# Patient Record
Sex: Female | Born: 1960 | Race: White | Hispanic: No | Smoking: Never smoker
Health system: Southern US, Community
[De-identification: ages and names within clinical notes are randomized; demographics above are authoritative.]

## PROBLEM LIST (undated history)

## (undated) DIAGNOSIS — K219 Gastro-esophageal reflux disease without esophagitis: Secondary | ICD-10-CM

## (undated) DIAGNOSIS — C801 Malignant (primary) neoplasm, unspecified: Secondary | ICD-10-CM

## (undated) DIAGNOSIS — T7840XA Allergy, unspecified, initial encounter: Secondary | ICD-10-CM

## (undated) DIAGNOSIS — M810 Age-related osteoporosis without current pathological fracture: Secondary | ICD-10-CM

## (undated) DIAGNOSIS — Z8719 Personal history of other diseases of the digestive system: Secondary | ICD-10-CM

## (undated) DIAGNOSIS — I10 Essential (primary) hypertension: Secondary | ICD-10-CM

## (undated) DIAGNOSIS — T8859XA Other complications of anesthesia, initial encounter: Secondary | ICD-10-CM

## (undated) DIAGNOSIS — E079 Disorder of thyroid, unspecified: Secondary | ICD-10-CM

## (undated) DIAGNOSIS — R011 Cardiac murmur, unspecified: Secondary | ICD-10-CM

## (undated) DIAGNOSIS — K5900 Constipation, unspecified: Secondary | ICD-10-CM

## (undated) DIAGNOSIS — E785 Hyperlipidemia, unspecified: Secondary | ICD-10-CM

## (undated) DIAGNOSIS — E039 Hypothyroidism, unspecified: Secondary | ICD-10-CM

## (undated) HISTORY — DX: Hyperlipidemia, unspecified: E78.5

## (undated) HISTORY — DX: Gastro-esophageal reflux disease without esophagitis: K21.9

## (undated) HISTORY — DX: Cardiac murmur, unspecified: R01.1

## (undated) HISTORY — DX: Allergy, unspecified, initial encounter: T78.40XA

## (undated) HISTORY — DX: Constipation, unspecified: K59.00

## (undated) HISTORY — PX: OTHER SURGICAL HISTORY: SHX169

## (undated) HISTORY — PX: BREAST EXCISIONAL BIOPSY: SUR124

## (undated) HISTORY — DX: Age-related osteoporosis without current pathological fracture: M81.0

## (undated) HISTORY — DX: Essential (primary) hypertension: I10

## (undated) HISTORY — DX: Disorder of thyroid, unspecified: E07.9

## (undated) HISTORY — DX: Malignant (primary) neoplasm, unspecified: C80.1

---

## 1998-09-24 ENCOUNTER — Other Ambulatory Visit: Admission: RE | Admit: 1998-09-24 | Discharge: 1998-09-24 | Payer: Self-pay | Admitting: *Deleted

## 1999-04-26 ENCOUNTER — Ambulatory Visit (HOSPITAL_COMMUNITY): Admission: RE | Admit: 1999-04-26 | Discharge: 1999-04-26 | Payer: Self-pay | Admitting: Internal Medicine

## 1999-11-10 ENCOUNTER — Other Ambulatory Visit: Admission: RE | Admit: 1999-11-10 | Discharge: 1999-11-10 | Payer: Self-pay | Admitting: *Deleted

## 1999-12-06 ENCOUNTER — Encounter: Payer: Self-pay | Admitting: Oral & Maxillofacial Surgery

## 1999-12-06 ENCOUNTER — Ambulatory Visit (HOSPITAL_COMMUNITY): Admission: RE | Admit: 1999-12-06 | Discharge: 1999-12-06 | Payer: Self-pay | Admitting: Oral & Maxillofacial Surgery

## 2000-04-16 ENCOUNTER — Ambulatory Visit (HOSPITAL_COMMUNITY): Admission: RE | Admit: 2000-04-16 | Discharge: 2000-04-16 | Payer: Self-pay | Admitting: *Deleted

## 2000-04-16 ENCOUNTER — Encounter (INDEPENDENT_AMBULATORY_CARE_PROVIDER_SITE_OTHER): Payer: Self-pay | Admitting: Specialist

## 2000-11-06 ENCOUNTER — Other Ambulatory Visit: Admission: RE | Admit: 2000-11-06 | Discharge: 2000-11-06 | Payer: Self-pay | Admitting: *Deleted

## 2001-05-03 ENCOUNTER — Encounter: Admission: RE | Admit: 2001-05-03 | Discharge: 2001-05-03 | Payer: Self-pay | Admitting: *Deleted

## 2001-11-12 ENCOUNTER — Other Ambulatory Visit: Admission: RE | Admit: 2001-11-12 | Discharge: 2001-11-12 | Payer: Self-pay | Admitting: Obstetrics and Gynecology

## 2002-04-04 ENCOUNTER — Encounter (INDEPENDENT_AMBULATORY_CARE_PROVIDER_SITE_OTHER): Payer: Self-pay | Admitting: Specialist

## 2002-04-04 ENCOUNTER — Ambulatory Visit (HOSPITAL_COMMUNITY): Admission: RE | Admit: 2002-04-04 | Discharge: 2002-04-04 | Payer: Self-pay | Admitting: Urology

## 2002-12-22 ENCOUNTER — Other Ambulatory Visit: Admission: RE | Admit: 2002-12-22 | Discharge: 2002-12-22 | Payer: Self-pay | Admitting: Obstetrics and Gynecology

## 2004-02-09 ENCOUNTER — Other Ambulatory Visit: Admission: RE | Admit: 2004-02-09 | Discharge: 2004-02-09 | Payer: Self-pay | Admitting: Obstetrics and Gynecology

## 2005-01-24 ENCOUNTER — Encounter (INDEPENDENT_AMBULATORY_CARE_PROVIDER_SITE_OTHER): Payer: Self-pay | Admitting: Specialist

## 2005-01-24 ENCOUNTER — Encounter: Admission: RE | Admit: 2005-01-24 | Discharge: 2005-01-24 | Payer: Self-pay | Admitting: Obstetrics and Gynecology

## 2005-02-03 ENCOUNTER — Ambulatory Visit: Payer: Self-pay | Admitting: Internal Medicine

## 2005-02-08 ENCOUNTER — Ambulatory Visit: Payer: Self-pay | Admitting: Cardiology

## 2005-02-09 ENCOUNTER — Encounter: Admission: RE | Admit: 2005-02-09 | Discharge: 2005-02-09 | Payer: Self-pay | Admitting: Obstetrics and Gynecology

## 2005-02-13 ENCOUNTER — Other Ambulatory Visit: Admission: RE | Admit: 2005-02-13 | Discharge: 2005-02-13 | Payer: Self-pay | Admitting: Obstetrics and Gynecology

## 2005-04-06 ENCOUNTER — Ambulatory Visit: Payer: Self-pay | Admitting: Internal Medicine

## 2006-05-25 ENCOUNTER — Encounter: Admission: RE | Admit: 2006-05-25 | Discharge: 2006-05-25 | Payer: Self-pay | Admitting: Obstetrics and Gynecology

## 2006-10-05 ENCOUNTER — Ambulatory Visit: Payer: Self-pay | Admitting: Internal Medicine

## 2007-02-08 ENCOUNTER — Ambulatory Visit: Payer: Self-pay | Admitting: Internal Medicine

## 2007-03-25 ENCOUNTER — Ambulatory Visit: Payer: Self-pay | Admitting: Internal Medicine

## 2007-03-25 LAB — CONVERTED CEMR LAB
BUN: 11 mg/dL (ref 6–23)
CO2: 30 meq/L (ref 19–32)
GFR calc Af Amer: 100 mL/min
Potassium: 4 meq/L (ref 3.5–5.1)
Sodium: 141 meq/L (ref 135–145)

## 2007-04-01 ENCOUNTER — Ambulatory Visit: Payer: Self-pay | Admitting: Internal Medicine

## 2007-04-01 LAB — CONVERTED CEMR LAB
ALT: 14 units/L (ref 0–40)
AST: 18 units/L (ref 0–37)
Albumin: 3.9 g/dL (ref 3.5–5.2)
Alkaline Phosphatase: 45 units/L (ref 39–117)
Direct LDL: 140.4 mg/dL
HDL: 55.4 mg/dL (ref >39.0)
Total Bilirubin: 0.8 mg/dL (ref 0.3–1.2)
VLDL: 15 mg/dL (ref 0–40)

## 2007-04-25 ENCOUNTER — Ambulatory Visit: Payer: Self-pay | Admitting: Internal Medicine

## 2007-10-23 ENCOUNTER — Encounter: Admission: RE | Admit: 2007-10-23 | Discharge: 2007-10-23 | Payer: Self-pay | Admitting: Obstetrics and Gynecology

## 2007-10-29 ENCOUNTER — Encounter: Admission: RE | Admit: 2007-10-29 | Discharge: 2007-10-29 | Payer: Self-pay | Admitting: Obstetrics and Gynecology

## 2008-04-09 ENCOUNTER — Ambulatory Visit: Payer: Self-pay | Admitting: Internal Medicine

## 2008-04-10 ENCOUNTER — Ambulatory Visit: Payer: Self-pay | Admitting: Internal Medicine

## 2008-04-10 LAB — CONVERTED CEMR LAB
BUN: 21 mg/dL (ref 6–23)
CO2: 29 meq/L (ref 19–32)
Calcium: 9.4 mg/dL (ref 8.4–10.5)
Chloride: 105 meq/L (ref 96–112)
Creatinine, Ser: 0.8 mg/dL (ref 0.4–1.2)
Glucose, Bld: 86 mg/dL (ref 70–99)

## 2009-03-01 DIAGNOSIS — E785 Hyperlipidemia, unspecified: Secondary | ICD-10-CM | POA: Insufficient documentation

## 2009-03-01 DIAGNOSIS — I959 Hypotension, unspecified: Secondary | ICD-10-CM | POA: Insufficient documentation

## 2009-07-30 ENCOUNTER — Encounter (INDEPENDENT_AMBULATORY_CARE_PROVIDER_SITE_OTHER): Payer: Self-pay | Admitting: *Deleted

## 2009-11-16 ENCOUNTER — Encounter (INDEPENDENT_AMBULATORY_CARE_PROVIDER_SITE_OTHER): Payer: Self-pay | Admitting: *Deleted

## 2009-11-18 ENCOUNTER — Encounter: Admission: RE | Admit: 2009-11-18 | Discharge: 2009-11-18 | Payer: Self-pay | Admitting: Obstetrics and Gynecology

## 2009-12-16 ENCOUNTER — Encounter (INDEPENDENT_AMBULATORY_CARE_PROVIDER_SITE_OTHER): Payer: Self-pay | Admitting: *Deleted

## 2009-12-20 ENCOUNTER — Ambulatory Visit: Payer: Self-pay | Admitting: Internal Medicine

## 2010-01-04 ENCOUNTER — Ambulatory Visit: Payer: Self-pay | Admitting: Internal Medicine

## 2010-11-17 ENCOUNTER — Encounter
Admission: RE | Admit: 2010-11-17 | Discharge: 2010-11-17 | Payer: Self-pay | Source: Home / Self Care | Admitting: Internal Medicine

## 2010-11-21 ENCOUNTER — Encounter
Admission: RE | Admit: 2010-11-21 | Discharge: 2010-11-21 | Payer: Self-pay | Source: Home / Self Care | Attending: Obstetrics and Gynecology | Admitting: Obstetrics and Gynecology

## 2010-11-29 ENCOUNTER — Encounter
Admission: RE | Admit: 2010-11-29 | Discharge: 2010-11-29 | Payer: Self-pay | Source: Home / Self Care | Attending: Obstetrics and Gynecology | Admitting: Obstetrics and Gynecology

## 2010-12-11 HISTORY — PX: OTHER SURGICAL HISTORY: SHX169

## 2010-12-15 ENCOUNTER — Encounter: Admission: RE | Admit: 2010-12-15 | Payer: Self-pay | Source: Home / Self Care | Admitting: Obstetrics and Gynecology

## 2010-12-31 ENCOUNTER — Other Ambulatory Visit: Payer: Self-pay | Admitting: Surgery

## 2010-12-31 DIAGNOSIS — R920 Mammographic microcalcification found on diagnostic imaging of breast: Secondary | ICD-10-CM

## 2011-01-01 ENCOUNTER — Encounter: Payer: Self-pay | Admitting: Obstetrics and Gynecology

## 2011-01-12 NOTE — Procedures (Signed)
Summary: Colonoscopy  Patient: [name] Note: All result statuses are Final unless otherwise noted.  Tests: (1) Colonoscopy (COL)   COL Colonoscopy           DONE     [facility]     [address]     [place], [place]  [address]           COLONOSCOPY PROCEDURE REPORT           PATIENT:  Victoria [name], Victoria [name]  MR#:  [id number]     BIRTHDATE:  04-28-1961, 48 yrs. old  GENDER:  female           ENDOSCOPIST:  Hedwig Morton. Juanda Chance, MD     Referred by:  Marcelle Overlie, M.D.           PROCEDURE DATE:  01/04/2010     PROCEDURE:  Colonoscopy 95[address]     ASA CLASS:  Class I     INDICATIONS:  history of pre-cancerous (adenomatous) colon polyps     aden. polyp x 2 1998., incomplete exam in 2000, normal colon in     2005     paternal GM colon cancer, daughter celiac disease           MEDICATIONS:   Fentanyl 125 mcg, Benadryl 12.5 mg IV, Versed 12 mg           DESCRIPTION OF PROCEDURE:   After the risks benefits and     alternatives of the procedure were thoroughly explained, informed     consent was obtained.  Digital rectal exam was performed and     revealed no rectal masses.   The LB [id number]-[id number] endoscope     was introduced through the anus and advanced to the cecum, which     was identified by both the appendix and ileocecal valve, without     limitations.  The quality of the prep was excellent, using     MiraLax.  The instrument was then slowly withdrawn as the colon     was fully examined.     <<PROCEDUREIMAGES>>           FINDINGS:  No polyps or cancers were seen (see image1, image2,     image3, and image4).  Internal hemorrhoids were found (see     image5).   Retroflexed views in the rectum revealed no     abnormalities.    The scope was then withdrawn from the patient     and the procedure completed.           COMPLICATIONS:  None           ENDOSCOPIC IMPRESSION:     1) No polyps or cancers     2) Normal colonoscopy     RECOMMENDATIONS:     1) high fiber diet       REPEAT EXAM:  In 7 year(s) for.           ______________________________     Hedwig Morton. Juanda Chance, MD           CC:           n.     eSIGNED:   Hedwig Morton. Brodie at 01/04/2010 08:44 AM           Lamount Cranker, [id number]  Note: An exclamation mark (!) indicates a result that was not dispersed into the flowsheet. Document Creation Date: 01/04/2010 8:45 AM _______________________________________________________________________  (1) Order result status: Final Collection or observation date-time: 01/04/2010 08:34  Requested date-time:  Receipt date-time:  Reported date-time:  Referring Physician:   Ordering Physician: Lina Sar [id number]) Specimen Source:  Source: Launa Grill Order Number: [id number] Lab site:   Appended Document: Colonoscopy    Clinical Lists Changes  Observations: Added new observation of COLONNXTDUE: 12/2016 (01/04/2010 12:42)

## 2011-01-12 NOTE — Letter (Signed)
Summary: [facility] Instructions  [facility]  [address] [place], [place] [address]   Phone: [phone]  Fax: [phone]       Victoria [name]    October 15, 1961    MRN: [id number]       Procedure Day /Date:  Tuesday 01/04/2010     Arrival Time:  7:30 am     Procedure Time:  8:00 am     Location of Procedure:                    _ x_  [facility] (4th Floor)   PREPARATION FOR COLONOSCOPY WITH MIRALAX  Starting 5 days prior to your procedure Thursday 1/20 do not eat nuts, seeds, popcorn, corn, beans, peas,  salads, or any raw vegetables.  Do not take any fiber supplements (e.g. Metamucil, Citrucel, and Benefiber). ____________________________________________________________________________________________________   THE DAY BEFORE YOUR PROCEDURE         DATE: Monday 1/24  1   Drink clear liquids the entire day-NO SOLID FOOD  2   Do not drink anything colored red or purple.  Avoid juices with pulp.  No orange juice.  3   Drink at least 64 oz. (8 glasses) of fluid/clear liquids during the day to prevent dehydration and help the prep work efficiently.  CLEAR LIQUIDS INCLUDE: Water Jello Ice Popsicles Tea (sugar ok, no milk/cream) Powdered fruit flavored drinks Coffee (sugar ok, no milk/cream) Gatorade Juice: apple, white grape, white cranberry  Lemonade Clear bullion, consomm, broth Carbonated beverages (any kind) Strained chicken noodle soup Hard Candy  4   Mix the entire bottle of Miralax with 64 oz. of Gatorade/Powerade in the morning and put in the refrigerator to chill.  5   At 3:00 pm take 2 Dulcolax/Bisacodyl tablets.  6   At 4:30 pm take one Reglan/Metoclopramide tablet.  7  Starting at 5:00 pm drink one 8 oz glass of the Miralax mixture every 15-20 minutes until you have finished drinking the entire 64 oz.  You should finish drinking prep around 7:30 or 8:00 pm.  8   If you are nauseated, you may take the 2nd Reglan/Metoclopramide tablet  at 6:30 pm.        9    At 8:00 pm take 2 more DULCOLAX/Bisacodyl tablets.     THE DAY OF YOUR PROCEDURE      DATE:  Tuesday 1/25  You may drink clear liquids until 6:00 am (2 HOURS BEFORE PROCEDURE).   MEDICATION INSTRUCTIONS  Unless otherwise instructed, you should take regular prescription medications with a small sip of water as early as possible the morning of your procedure.    Additional medication instructions: Hold Spironolactone day of procedure.         OTHER INSTRUCTIONS  You will need a responsible adult at least 48 years of age to accompany you and drive you home.   This person must remain in the waiting room during your procedure.  Wear loose fitting clothing that is easily removed.  Leave jewelry and other valuables at home.  However, you may wish to bring a book to read or an iPod/MP3 player to listen to music as you wait for your procedure to start.  Remove all body piercing jewelry and leave at home.  Total time from sign-in until discharge is approximately 2-3 hours.  You should go home directly after your procedure and rest.  You can resume normal activities the day after your procedure.  The day of  your procedure you should not:   Drive   Make legal decisions   Operate machinery   Drink alcohol   Return to work  You will receive specific instructions about eating, activities and medications before you leave.   The above instructions have been reviewed and explained to me by   Ezra Sites RN  December 20, 2009 9:38 AM     I fully understand and can verbalize these instructions _____________________________ Date _______

## 2011-01-12 NOTE — Miscellaneous (Signed)
Summary: [facility]  Clinical Lists Changes  Medications: Added new medication of MIRALAX   POWD (POLYETHYLENE GLYCOL 3350) As per prep  instructions. - Signed Added new medication of REGLAN 10 MG  TABS (METOCLOPRAMIDE HCL) As per prep instructions. - Signed Added new medication of DULCOLAX 5 MG  TBEC (BISACODYL) Day before procedure take 2 at 3pm and 2 at 8pm. - Signed Rx of MIRALAX   POWD (POLYETHYLENE GLYCOL 3350) As per prep  instructions.;  #255gm x 0;  Signed;  Entered by: Ezra Sites RN;  Authorized by: Hart Carwin MD;  Method used: Electronically to [facility] [address] [place], [place]  [address], Ph: [phone] or [phone], Fax: [phone] Rx of REGLAN 10 MG  TABS (METOCLOPRAMIDE HCL) As per prep instructions.;  #2 x 0;  Signed;  Entered by: Ezra Sites RN;  Authorized by: Hart Carwin MD;  Method used: Electronically to [facility] [address], [place], [place]  [address], Ph: [phone] or [phone], Fax: [phone] Rx of DULCOLAX 5 MG  TBEC (BISACODYL) Day before procedure take 2 at 3pm and 2 at 8pm.;  #4 x 0;  Signed;  Entered by: Ezra Sites RN;  Authorized by: Hart Carwin MD;  Method used: Electronically to [facility] [address], [place], [place]  [address], Ph: [phone] or [phone], Fax: [phone] Allergies: Added new allergy or adverse reaction of PENICILLIN Added new allergy or adverse reaction of * ADHESIVE TAPE Observations: Added new observation of NKA: F (12/20/2009 8:45)    Prescriptions: DULCOLAX 5 MG  TBEC (BISACODYL) Day before procedure take 2 at 3pm and 2 at 8pm.  #4 x 0   Entered by:   Ezra Sites RN   Authorized by:   Hart Carwin MD   Signed by:   Ezra Sites RN on 12/20/2009   Method used:   Electronically to        [facility] (retail)       [address]       [place], [place]  [address]       Ph: [phone] or [phone]       Fax: [phone]   RxID:   [id number] REGLAN 10 MG  TABS  (METOCLOPRAMIDE HCL) As per prep instructions.  #2 x 0   Entered by:   Ezra Sites RN   Authorized by:   Hart Carwin MD   Signed by:   Ezra Sites RN on 12/20/2009   Method used:   Electronically to        [facility] (retail)       [address]       [place], [place]  [address]       Ph: [phone] or [phone]       Fax: [phone]   RxID:   [id number] MIRALAX   POWD (POLYETHYLENE GLYCOL 3350) As per prep  instructions.  #255gm x 0   Entered by:   Ezra Sites RN   Authorized by:   Hart Carwin MD   Signed by:   Ezra Sites RN on 12/20/2009   Method used:   Electronically to        [facility] (retail)       [address]       [place], [place]  [address]       Ph: [phone] or [phone]       Fax: [phone]   RxID:   [id number]

## 2011-01-19 ENCOUNTER — Other Ambulatory Visit: Payer: Self-pay | Admitting: Surgery

## 2011-01-19 DIAGNOSIS — R920 Mammographic microcalcification found on diagnostic imaging of breast: Secondary | ICD-10-CM

## 2011-01-25 ENCOUNTER — Ambulatory Visit
Admission: RE | Admit: 2011-01-25 | Discharge: 2011-01-25 | Disposition: A | Discharge status: Home / Self Care | Payer: 59 | Source: Ambulatory Visit | Attending: Surgery | Admitting: Surgery

## 2011-01-25 ENCOUNTER — Other Ambulatory Visit: Payer: Self-pay | Admitting: Surgery

## 2011-01-25 ENCOUNTER — Other Ambulatory Visit (HOSPITAL_COMMUNITY)
Admission: RE | Admit: 2011-01-25 | Discharge: 2011-01-25 | Disposition: A | Discharge status: Home / Self Care | Payer: 59 | Source: Ambulatory Visit | Attending: Obstetrics and Gynecology | Admitting: Obstetrics and Gynecology

## 2011-01-25 DIAGNOSIS — N83209 Unspecified ovarian cyst, unspecified side: Secondary | ICD-10-CM | POA: Insufficient documentation

## 2011-01-25 DIAGNOSIS — R920 Mammographic microcalcification found on diagnostic imaging of breast: Secondary | ICD-10-CM

## 2011-01-25 DIAGNOSIS — N6009 Solitary cyst of unspecified breast: Secondary | ICD-10-CM | POA: Insufficient documentation

## 2011-01-27 ENCOUNTER — Ambulatory Visit (HOSPITAL_COMMUNITY)
Admission: RE | Admit: 2011-01-27 | Discharge: 2011-01-27 | Disposition: A | Discharge status: Home / Self Care | Payer: 59 | Source: Ambulatory Visit | Attending: Obstetrics and Gynecology | Admitting: Obstetrics and Gynecology

## 2011-01-27 DIAGNOSIS — M79609 Pain in unspecified limb: Secondary | ICD-10-CM | POA: Insufficient documentation

## 2011-04-25 NOTE — Assessment & Plan Note (Signed)
[facility]                            CARDIOLOGY OFFICE NOTE   NAME:Victoria [name], Victoria [name]                        MRN:          [id number]  DATE:04/09/2008                            DOB:          10/11/1961    IDENTIFICATION:  The patient is a 50 year old woman with a history of  hypertension and dyslipidemia.  I last saw her back in May 2008.   HISTORY OF PRESENT ILLNESS:  Since seen, the patient has said she had  stopped the Elmiron, and her blood pressure improved.  Her swelling in  her hands also improved.  She is taking Lasix here and there as needed.  Her breathing is okay.  Blood pressure at home has been 124-135/74-88.  She takes Maxzide on and off.   Her current medicines now include:  1. Amitriptyline 10.  2. Prevacid 30 p.r.n.  3. Triamterene hydrochlorothiazide 37.5/25 one half daily.   PHYSICAL EXAMINATION:  On exam, the patient is in no distress here.  Blood pressure is 135/90 pulse is 88, weight 136 which is up from 128  when last checked.  Her lungs are clear.  CARDIAC EXAM:  Regular rate and rhythm, S1-S2, no S3, no murmurs.  ABDOMEN:  Benign.  EXTREMITIES:  No edema.   IMPRESSION:  1. Hypertension, borderline here, better at home.  I am not sure she      does not need more medicine.  What I have recommended and she is      eager to work on her diet and exercise, try to get her weight down.      I have recommend we check a Lipomed, BMET, AST, thyroid.  Also      check a CRP.  2. Dyslipidemia.  As noted above.  Labs pending.   I will be in touch with the patient once I have seen all of the lab  results.     Pricilla Riffle, MD, [facility]  Electronically Signed    PVR/MedQ  DD: 04/21/2008  DT: 04/21/2008  Job #: [id number]

## 2011-04-25 NOTE — Assessment & Plan Note (Signed)
[facility]                            CARDIOLOGY OFFICE NOTE   NAME:[name], [name]                        MRN:          [id number]  DATE:04/25/2007                            DOB:          Sep 16, 1961    IDENTIFICATION:  The patient is a 50 year old woman with a history of  hypertension. I last saw her in April.   When I saw her last, I told her to increase her Maxzide to a whole  tablet. Note, she is still taking a half in the morning and then she  will take her blood pressure and if it is high she will take another  half again as needed (half of 37.5/25). She says some days her blood  pressure is okay in the 120s/80s, but some days when she is stressed she  will have to take an additional hypertensive. Again, she is through a  lot of her projects and thinks her stress level is less.   Occasional chest tightness comes and goes usually again when stressed.  Has not had any over the past week. Mental stress has gone down. Denies  any association with activity.   On reviewing her diet, for breakfast, she will have a whole egg. She  will eat ice cream and some chocolates.   CURRENT MEDICATIONS:  1. Elmiron 100.  2. Amitriptyline 10.  3. Prevacid.  4. Triamterene/hydrochlorothiazide one-half 37.5/25, occasionally a      whole.   PHYSICAL EXAMINATION:  The patient is in no distress. Blood pressure  115/86. Pulse 93 and regular. Weight 128.  LUNGS:  Clear.  NECK: JVP is normal.  CARDIAC: Regular rate and rhythm. S1, S2. No S3.  ABDOMEN: Benign.  EXTREMITIES: No edema.   IMPRESSION:  1. Hypertension. The patient will continue to follow and take her      Maxzide. Again, I think she is on the border to needing a whole      tablet, but she is taking off and will follow.  2. Dyslipidemia. Counseled her on diet modifications. Again, her LDL      is about 140, HDL of 55. She will try to make some changes and will      recheck in the fall. Encouraged  her to stay active.  3. Chest tightness. Again, I am not convinced it is cardiac. Has      Prevacid. Will follow.   I will follow up in the fall, sooner if problems develop.     Victoria Riffle, MD, [name]  Electronically Signed    PVR/MedQ  DD: 04/29/2007  DT: 04/29/2007  Job #: [id number]

## 2011-04-28 NOTE — Op Note (Signed)
[facility]  Patient:    Victoria [name], Victoria [name] Visit Number: [id number] MRN: [id number]          Service Type: DSU Location: DAY Attending Physician:  Londell Moh Dictated by:   Jamison Neighbor, M.D. Proc. Date: 04/04/02 Admit Date:  04/04/2002   CC:         Marcelle Overlie, M.D.   Operative Report  PREOPERATIVE DIAGNOSIS:  Pelvic pain probable interstitial cystitis.  POSTOPERATIVE DIAGNOSIS:  Interstitial cystitis.  PROCEDURE:  Cystoscopy, urethral calibration, hydrodistention of the bladder, Marcaine and Pyridium instillation, Marcaine and Kenalog injection.  SURGEON:  Jamison Neighbor, M.D.  ANESTHESIA:  General.  COMPLICATIONS:  None.  DRAINS:  None.  BRIEF HISTORY:  This 50 year old female has had lower urinary tract symptoms including urgency and frequency.  The patient has had longstanding problems with both problems as well as dyspareunia.  She has previously been evaluated by Dr. Darvin Neighbours ______  cystoscopy under anesthesia which was quite painful, although it should be noted that this is certainly not a standard method of making a diagnosis of interstitial cystitis.  The patient was treated with various anticholinergics with modest improvement.  The patient still has problems with pain and dyspareunia.  She is also known to have migraines, TMJ syndrome, and multiple allergies, all of which would suggest that the patient might have interstitial cystitis.  The patient is now to undergo diagnostic cystoscopy under anesthesia to determine if she does indeed have this disease. She understands the risks and benefits of the procedure.  She knows that there is a chance that she will have some improvement in her symptoms, but that will most likely be short-lived, and that long-term therapy will be necessary.  She gave full and informed consent.  DESCRIPTION OF PROCEDURE:  After the successful induction of general anesthesia, the patient  was placed in the dorsal lithotomy position and prepped with Betadine and draped in the usual sterile fashion.  The patient had no evidence of cystocele, rectocele, or enterocele.  There was no evidence of urethral mass or diverticulum.  The urethra was calibrated to 30 [place] with female urethral sounds with no evidence of stenosis or stricture.  The bladder was carefully inspected.  It was free of any tumor or stones.  Both ureteral orifices were normal in configuration and location.  The bladder was distended at a pressure of 100 cm of water for five minutes.  When the bladder was drained, she had terminal bleeding at the end of the drain out cycle. Glomerulations were seen throughout the bladder.  She had a bladder capacity of only 500 cc which is less than half of normal.  The patient had a bladder biopsy, and the biopsy site was cauterized and sent for mast cell analysis. The biopsy site was cauterized with a Bugbee electrode.  A mixture of Marcaine Pyridium was left within the bladder.  Marcaine and Kenalog were injected periurethrally.  A B&O suppository was inserted.  The patient has had intraoperative Toradol and Zofran.  She will be sent home with a prescription for Lorcet Plus, Pyridium Plus, and a short course of antibiotics.  We will see her back in follow-up in 1-2 weeks time and will make plans for long-term therapy which may include either instillation therapy or oral therapy with an Elmiron-based protocol. Dictated by:   Jamison Neighbor, M.D. Attending Physician:  Londell Moh DD:  04/04/02 TD:  04/04/02 Job: [id number]

## 2011-04-28 NOTE — Op Note (Signed)
[facility]  Patient:    Victoria [name], Victoria [name]                        MRN: [id number] Proc. Date: 04/16/00 Adm. Date:  [id number] Disc. Date: [id number] Attending:  Marin Comment CC:         Al Decant. Janey Greaser, M.D.             Pershing Cox, M.D.                           Operative Report  PREOPERATIVE DIAGNOSIS:  Metrorrhagia with office hydrosonogram suggesting a small fundal polyp.  POSTOPERATIVE DIAGNOSES:  No evidence of fundal polyps, no evidence of submucous myoma, shaggy lower uterine segment endometrial tissue.  PROCEDURE:  SURGEON:  Pershing Cox, M.D.  ANESTHESIA:  INDICATION FOR PROCEDURE:  Patient is followed in my office for routine gynecologic care.  In November, at her annual examination, she complained of midcycle spotting which had been going on for approximately one year.  She was given a menstrual calendar and returned for sonogram in January of this year. At that time, she was documented to have two very small uterine fibroids and one small paracervical fibroid.  Her endometrial lining at that time was 0.8 cm and echogenic in character.  She was brought back to the office for hydrosonogram if her spotting continued, it did continue, and on March 16, 2000, hydrosonogram was performed following a menstrual period.  Her endometrial lining was still thickened, approximately 8 mm in size.  There was a hyperechoic mass located at the uterine fundus which was 0.3 x 0.3 in size, felt to be a small polyp, maybe the cause of her midcycle spotting.  She is brought to the operating room for evaluation and excision of this polyp.  OPERATIVE FINDINGS:  Uterine cavity was 3 cm in size.  The uterus was 8 to 10 cm in size on bimanual examination.  There were no adnexal masses.  Endoscopic examination of the uterine cavity visualized both ostia.  There was no evidence of submucous filling defect, either submucosa myoma or  discrete polyp.  There was heavy shaggy lower uterine segment endometrium.  This as well as some of the fundal endometrium were resected with the rectoscope.  The two specimens were endocervical curettings and endometrial curettings and resection.  DESCRIPTION OF PROCEDURE:  Victoria [name] was brought to the operating room with an IV in place.  She had received a gram of Ancef in the holding area.  Supine on the OR table, IV sedation was administered.  She was then placed into sling stirrups and exam under anesthesia was performed.  There was a very thick heavy vaginal discharge consistent with barrier contraception.  This was removed with digital examination and bimanual exam was performed.  The lower abdomen, perineum, upper thighs and vagina were prepped with a solution of Hibiclens and the patient was sterilely draped for a vaginal procedure. Bivalved speculum was inserted into the vagina.  Marcaine 0.25% was instilled into the anterior cervix, which was then grasped with a single-tooth tenaculum.  Paracervical block was administered at the 3, 4, 7 and 8 position, giving a total volume of 10 cc of 0.25% Marcaine.  Kevorkian curette was used to obtain endometrial curettings.  A uterine sound passed to a depth of 9 cm. Serial Pratt dilators were used to dilate the cervix to a size 33  and the resectoscope was introduced.  Using through-and-through sorbitol irrigation, the cavity was visualized and photographed.  Next, the small areas of uterine tissue at the fundus were resected and the rectoscope was removed.  A sharp curette was used to curette all the uterine walls and the curettings were removed by suction as best as possible.  The hysteroscope was reintroduced and there was no visible tissue other than floating fragments.  The Meigs curette was then used to curette the uterine walls and an attempt was made to retrieve as much of the curetted endometrium as possible, retrieving it with  the resectoscope.  It was combined with the fragments of curetted endometrium for a single specimen.  Again, no discrete polyps were noted.  Patient tolerated the procedure well and taken to the recovery room where she received IV Toradol and was discharged later in the morning. DD:  04/16/00 TD:  04/17/00 Job: [id number]

## 2011-04-28 NOTE — Assessment & Plan Note (Signed)
[facility]                            CARDIOLOGY OFFICE NOTE   NAME:[name], Victoria GREGGS                        MRN:          [id number]  DATE:02/08/2007                            DOB:          Nov 25, 1961    IDENTIFICATION:  [name] is a 50 year old woman, history of mild  hypertension, last seen in October.   When I saw her I added Norvasc to her regimen.  I thought a followup in  6 weeks.  Unfortunately she has had family issues develop, her father  passed away and she was tied up with this.  She did have blood work  drawn in [place], she said, though she did not bring it with her today.   Otherwise she says she has been feeling okay, she is trying to increase  her physical activity, getting her life back organized now that she is  back in [place].  Note daughter also with celiac disease, she is  trying to follow a gluten-free diet with her.   CURRENT MEDICATIONS:  1. Elmiron nightly.  2. Amitriptyline 10 daily.  3. Prevacid p.r.n.  4. Norvasc 2.5.   PHYSICAL EXAMINATION:  The patient is in no distress.  Blood pressure is 145/90, on my check 122/88, pulse is 88 and regular,  weight 129, stable.  LUNGS:  Clear.  CARDIAC:  Regular rate and rhythm, S1, S2, no S3, no murmurs.  ABDOMEN:  Benign.  EXTREMITIES:  No edema.   IMPRESSION:  1. Hypertension, again borderline with minimal elevation of her      diastolic pressures.  She has got a little edema in her hands she      complains about.  I told her to hold this and try a half of a      Maxzide 37.5/25, follow up in 1 month's time to see how she      tolerates this.   She is on the amitriptyline, which may be exacerbating some.  1. Health care maintenance.  Discussed increasing her physical      activity.  Again review of her lipid panel has been remote, I would      like to have another fasting lipid checked, LDL was 138.   Follow up as noted.     Pricilla Riffle, MD, [facility]  Electronically Signed    PVR/MedQ  DD: 02/08/2007  DT: 02/08/2007  Job #: [id number]   cc:   Marcelino Duster L. Vincente Poli, M.D.

## 2011-04-28 NOTE — Assessment & Plan Note (Signed)
[facility]                            CARDIOLOGY OFFICE NOTE   NAME:[name], KASSIDEE NARCISO                        MRN:          [id number]  DATE:03/25/2007                            DOB:          Aug 04, 1961    PATIENT IDENTIFICATION:  [name] is a 50 year old woman with a history  of hypertension. She was last seen in February.   When I saw her last, her blood pressure was a little up and I added a  half tablet of Maxzide (37.5/25) to her regimen.   In the interval, she says she has been feeling pretty good. She is  actually very active, working out.   She had a long day touring a college campus this weekend and complained  of some dizziness and some puffiness on the weekend. She says though her  puffiness overall has improved on the Maxzide and her feet hurt less.   CURRENT MEDICATIONS:  1. Elmiron 100 q.h.s.  2. Amitriptyline 10 daily.  3. Prevacid 30 daily.  4. Maxzide 37.5/25, 1/2 daily.   PHYSICAL EXAMINATION:  GENERAL:  The patient is in no distress.  VITAL SIGNS:  Blood pressure 134/93, on my check 134/94.  LUNGS:  Clear.  CARDIAC:  Regular rate and rhythm, S1, S2, no S3.  ABDOMEN:  Benign.  EXTREMITIES:  No edema.   IMPRESSION:  1. Hypertension. Would increase Maxzide to a whole tablet. Will check      a BMET today and also in one week.  2. Dyslipidemia. Will need to get fasting lipids at some point.   I will set to see her back in 4 weeks, sooner if problems develop.     Pricilla Riffle, MD, [facility]  Electronically Signed    PVR/MedQ  DD: 03/25/2007  DT: 03/26/2007  Job #: [id number]

## 2011-04-28 NOTE — Assessment & Plan Note (Signed)
[facility]                              CARDIOLOGY OFFICE NOTE   NAME:[name], [name]                        MRN:          [id number]  DATE:10/05/2006                            DOB:          [birth year]    IDENTIFICATION:  [name] is a 50 year old woman who I saw back in April  of 2006. She has a history of hypertension (mild).   In the interval she has done okay.  She still says she is trying to get to  exercising.  She notes an episode of chest discomfort last week.  It kinda  hurt on and off 30 seconds to a minute at a time, plus/minus activity,  associated with back pain as well. She thinks she may have pulled something.   CURRENT MEDICATIONS:  1. Elmiron 100 mg at night.  2. Amitriptyline 10 mg at night.  3. Prevacid 30 mg daily.   PAST MEDICAL HISTORY:  Interstitial cystitis, mild hypertension and  gastroesophageal reflux.   PHYSICAL EXAMINATION:  GENERAL:  The patient is in no distress.  VITAL SIGNS:  Blood pressure 133/92, pulse 98, weight 129.  LUNGS:  Clear.  NECK:  JVP is normal.  No bruits.  CARDIAC:  Regular rate and rhythm.  S1 and S2.  No S3.  No murmurs.  ABDOMEN:  Benign.  EXTREMITIES:  Good distal pulses, equal onset.  No edema.   A 12-lead EKG shows normal sinus rhythm, 97 beats per minute.   IMPRESSION:  1. Hypertension.  The patient reports at home her diastolics are as a      whole are running around 90, systolics in the 140s.  Had been 150s.  I      think she needs to start antihypertensive therapy.  We have seen this      now for years.  I will go ahead and give her a prescription for 2.5 of      Norvasc and I will see her back in about six weeks.  2. Health care maintenance.  Will need to follow up on fasting lipids.      Actually she had one done in 2006, total of 209, HDL 59, LDL 137.      Again will need to check at her next visit.    ______________________________  Pricilla Riffle, MD, [facility]    PVR/MedQ   DD: 10/05/2006  DT: 10/07/2006  Job #: [id number]   cc:   Marcelino Duster L. Vincente Poli, M.D.

## 2011-09-04 ENCOUNTER — Other Ambulatory Visit: Payer: Self-pay | Admitting: Family Medicine

## 2011-09-04 DIAGNOSIS — N390 Urinary tract infection, site not specified: Secondary | ICD-10-CM

## 2011-09-04 DIAGNOSIS — E278 Other specified disorders of adrenal gland: Secondary | ICD-10-CM

## 2011-09-08 ENCOUNTER — Ambulatory Visit
Admission: RE | Admit: 2011-09-08 | Discharge: 2011-09-08 | Disposition: A | Discharge status: Home / Self Care | Payer: 59 | Source: Ambulatory Visit | Attending: Family Medicine | Admitting: Family Medicine

## 2011-09-08 DIAGNOSIS — N390 Urinary tract infection, site not specified: Secondary | ICD-10-CM

## 2011-09-08 DIAGNOSIS — E278 Other specified disorders of adrenal gland: Secondary | ICD-10-CM

## 2011-11-07 ENCOUNTER — Other Ambulatory Visit: Payer: Self-pay | Admitting: Obstetrics and Gynecology

## 2011-11-07 DIAGNOSIS — Z1231 Encounter for screening mammogram for malignant neoplasm of breast: Secondary | ICD-10-CM

## 2011-11-23 ENCOUNTER — Ambulatory Visit
Admission: RE | Admit: 2011-11-23 | Discharge: 2011-11-23 | Disposition: A | Discharge status: Home / Self Care | Payer: 59 | Source: Ambulatory Visit | Attending: Obstetrics and Gynecology | Admitting: Obstetrics and Gynecology

## 2011-11-23 DIAGNOSIS — Z1231 Encounter for screening mammogram for malignant neoplasm of breast: Secondary | ICD-10-CM

## 2012-02-07 ENCOUNTER — Other Ambulatory Visit: Payer: Self-pay | Admitting: Obstetrics and Gynecology

## 2012-11-15 ENCOUNTER — Other Ambulatory Visit: Payer: Self-pay | Admitting: Obstetrics and Gynecology

## 2012-11-15 DIAGNOSIS — Z1231 Encounter for screening mammogram for malignant neoplasm of breast: Secondary | ICD-10-CM

## 2012-11-28 ENCOUNTER — Ambulatory Visit
Admission: RE | Admit: 2012-11-28 | Discharge: 2012-11-28 | Disposition: A | Discharge status: Home / Self Care | Payer: 59 | Source: Ambulatory Visit | Attending: Obstetrics and Gynecology | Admitting: Obstetrics and Gynecology

## 2012-11-28 DIAGNOSIS — Z1231 Encounter for screening mammogram for malignant neoplasm of breast: Secondary | ICD-10-CM

## 2012-12-02 ENCOUNTER — Other Ambulatory Visit: Payer: Self-pay | Admitting: Obstetrics and Gynecology

## 2012-12-02 DIAGNOSIS — R928 Other abnormal and inconclusive findings on diagnostic imaging of breast: Secondary | ICD-10-CM

## 2012-12-02 DIAGNOSIS — R921 Mammographic calcification found on diagnostic imaging of breast: Secondary | ICD-10-CM

## 2012-12-05 ENCOUNTER — Other Ambulatory Visit: Payer: Self-pay | Admitting: Obstetrics and Gynecology

## 2012-12-05 ENCOUNTER — Ambulatory Visit
Admission: RE | Admit: 2012-12-05 | Discharge: 2012-12-05 | Disposition: A | Discharge status: Home / Self Care | Payer: 59 | Source: Ambulatory Visit | Attending: Obstetrics and Gynecology | Admitting: Obstetrics and Gynecology

## 2012-12-05 DIAGNOSIS — R928 Other abnormal and inconclusive findings on diagnostic imaging of breast: Secondary | ICD-10-CM

## 2012-12-09 ENCOUNTER — Other Ambulatory Visit: Payer: Self-pay | Admitting: Nurse Practitioner

## 2012-12-09 ENCOUNTER — Ambulatory Visit
Admission: RE | Admit: 2012-12-09 | Discharge: 2012-12-09 | Disposition: A | Discharge status: Home / Self Care | Payer: 59 | Source: Ambulatory Visit | Attending: Nurse Practitioner | Admitting: Nurse Practitioner

## 2012-12-09 DIAGNOSIS — R079 Chest pain, unspecified: Secondary | ICD-10-CM

## 2012-12-18 ENCOUNTER — Other Ambulatory Visit: Payer: Self-pay | Admitting: Obstetrics and Gynecology

## 2012-12-18 ENCOUNTER — Ambulatory Visit
Admission: RE | Admit: 2012-12-18 | Discharge: 2012-12-18 | Disposition: A | Discharge status: Home / Self Care | Payer: 59 | Source: Ambulatory Visit | Attending: Obstetrics and Gynecology | Admitting: Obstetrics and Gynecology

## 2012-12-18 DIAGNOSIS — R928 Other abnormal and inconclusive findings on diagnostic imaging of breast: Secondary | ICD-10-CM

## 2012-12-18 DIAGNOSIS — R921 Mammographic calcification found on diagnostic imaging of breast: Secondary | ICD-10-CM

## 2012-12-19 ENCOUNTER — Ambulatory Visit
Admission: RE | Admit: 2012-12-19 | Discharge: 2012-12-19 | Disposition: A | Discharge status: Home / Self Care | Payer: 59 | Source: Ambulatory Visit | Attending: Obstetrics and Gynecology | Admitting: Obstetrics and Gynecology

## 2012-12-19 DIAGNOSIS — R928 Other abnormal and inconclusive findings on diagnostic imaging of breast: Secondary | ICD-10-CM

## 2013-02-17 ENCOUNTER — Other Ambulatory Visit: Payer: Self-pay | Admitting: Obstetrics and Gynecology

## 2013-11-10 ENCOUNTER — Other Ambulatory Visit: Payer: Self-pay

## 2013-11-10 DIAGNOSIS — Z1231 Encounter for screening mammogram for malignant neoplasm of breast: Secondary | ICD-10-CM

## 2013-11-27 ENCOUNTER — Telehealth: Payer: Self-pay | Admitting: Internal Medicine

## 2013-11-27 NOTE — Telephone Encounter (Signed)
Informed pt that due to the holidays, Dr. Graciela Husbands is out of the office until the end of the month. I will contact her once he is back in office. She is agreeable to plan.

## 2013-11-27 NOTE — Telephone Encounter (Signed)
New Message  Pt called requests a call back.. She has moved to [place] and requests a referral for a cardiologist in the area.. Please assist.

## 2013-12-12 ENCOUNTER — Ambulatory Visit
Admission: RE | Admit: 2013-12-12 | Discharge: 2013-12-12 | Disposition: A | Discharge status: Home / Self Care | Payer: 59 | Source: Ambulatory Visit

## 2013-12-12 DIAGNOSIS — Z1231 Encounter for screening mammogram for malignant neoplasm of breast: Secondary | ICD-10-CM

## 2013-12-19 NOTE — Telephone Encounter (Signed)
Advised Victoria [name] that Dr. Caryl Comes did not know of cardiologist to refer her to in [place], keep appt with Dr. Heide Guile. Victoria [name] thanked me and agreeable to plan.

## 2014-02-20 ENCOUNTER — Other Ambulatory Visit: Payer: Self-pay | Admitting: Obstetrics and Gynecology

## 2014-04-29 ENCOUNTER — Other Ambulatory Visit: Payer: Self-pay | Admitting: Obstetrics and Gynecology

## 2014-08-27 ENCOUNTER — Other Ambulatory Visit: Payer: Self-pay | Admitting: Obstetrics and Gynecology

## 2014-08-28 LAB — CYTOLOGY - PAP

## 2014-11-18 ENCOUNTER — Other Ambulatory Visit: Payer: Self-pay

## 2014-11-18 DIAGNOSIS — Z1231 Encounter for screening mammogram for malignant neoplasm of breast: Secondary | ICD-10-CM

## 2014-12-17 ENCOUNTER — Ambulatory Visit
Admission: RE | Admit: 2014-12-17 | Discharge: 2014-12-17 | Disposition: A | Discharge status: Home / Self Care | Payer: 59 | Source: Ambulatory Visit

## 2014-12-17 DIAGNOSIS — Z1231 Encounter for screening mammogram for malignant neoplasm of breast: Secondary | ICD-10-CM

## 2015-07-15 ENCOUNTER — Encounter: Payer: Self-pay | Admitting: Internal Medicine

## 2016-11-21 ENCOUNTER — Other Ambulatory Visit: Payer: Self-pay | Admitting: Obstetrics and Gynecology

## 2016-11-21 DIAGNOSIS — Z1231 Encounter for screening mammogram for malignant neoplasm of breast: Secondary | ICD-10-CM

## 2016-11-28 ENCOUNTER — Encounter: Payer: Self-pay | Admitting: Physical Therapy

## 2016-11-28 ENCOUNTER — Ambulatory Visit: Payer: BLUE CROSS/BLUE SHIELD | Attending: Obstetrics and Gynecology | Admitting: Physical Therapy

## 2016-11-28 DIAGNOSIS — M6281 Muscle weakness (generalized): Secondary | ICD-10-CM

## 2016-11-28 DIAGNOSIS — R279 Unspecified lack of coordination: Secondary | ICD-10-CM | POA: Diagnosis present

## 2016-11-28 DIAGNOSIS — M62838 Other muscle spasm: Secondary | ICD-10-CM | POA: Diagnosis present

## 2016-11-28 NOTE — Therapy (Signed)
[facility] [address] [place], [place], [address] Phone: [phone]   Fax:  [phone]  Physical Therapy Evaluation  Patient Details  Name: Victoria [name] MRN: [id number] Date of Birth: 1961-12-09 Referring Provider: Dr. Dian Queen  Encounter Date: 11/28/2016      PT End of Session - 11/28/16 1117    Visit Number 1   Date for PT Re-Evaluation 03/29/17   PT Start Time 0935   PT Stop Time 1015   PT Time Calculation (min) 40 min   Activity Tolerance Patient tolerated treatment well   Behavior During Therapy [facility] for tasks assessed/performed      Past Medical History:  Diagnosis Date  . Hypertension   . Osteoporosis   . Thyroid disease     Past Surgical History:  Procedure Laterality Date  . bladder distention    . right cyst removed  2012    There were no vitals filed for this visit.       Subjective Assessment - 11/28/16 0946    Subjective trouble voiding.  The vaginal valium has helped her sleep through the night and void increased amount of urine.  [name] had physical therapy with Ileana Roup for TMJ and PT for urinary incontinence. In the past year she has been exposed to mold and her incontinence, urgency, frequency has become worse.    Patient Stated Goals decrease pelvic pain, how to fix incontinence, urinary frequency and urgency   Currently in Pain? Yes   Pain Score 3    Pain Location Abdomen   Pain Orientation Lower   Pain Descriptors / Indicators Dull   Pain Type Chronic pain   Pain Onset Other (comment)  2 years   Pain Frequency Intermittent   Aggravating Factors  going off IC diet, stress, constipation   Pain Relieving Factors being on IC diet, loose some weight   Multiple Pain Sites No            [facility] PT Assessment - 11/28/16 0001      Assessment   Medical Diagnosis Pelvic floor dysfunction   Referring Provider Dr. Dian Queen   Onset Date/Surgical Date  11/11/15   Prior Therapy none     Precautions   Precautions Other (comment)   Precaution Comments osteoporosis     Restrictions   Weight Bearing Restrictions No     Balance Screen   Has the patient fallen in the past 6 months No   Has the patient had a decrease in activity level because of a fear of falling?  No   Is the patient reluctant to leave their home because of a fear of falling?  No     Home Ecologist residence     Prior Function   Level of Independence Independent   Vocation Requirements lawyer     Cognition   Overall Cognitive Status Within Functional Limits for tasks assessed     Observation/Other Assessments   Focus on Therapeutic Outcomes (FOTO)  58% limitation for urinary problem     Posture/Postural Control   Posture/Postural Control No significant limitations     ROM / Strength   AROM / PROM / Strength Strength     Strength   Right Hip ABduction 3+/5   Right Hip ADduction 3+/5   Left Hip Extension 4/5   Left Hip ABduction 3+/5   Left Hip ADduction 4/5     Palpation   SI assessment  pelvic in  correct alignment   Palpation comment palpable tenderness located in right lower quadrant; tenderness located in lumbar paraspinals and gluteal     Ambulation/Gait   Ambulation/Gait No                 Pelvic Floor Special Questions - 11/28/16 0001    Prior Pregnancies Yes   Number of Pregnancies 2   Number of C-Sections 2   Any difficulty with labor and deliveries Yes  first one stuck in vaginal canal   Currently Sexually Active No   Urinary Leakage Yes  feels like leaking all the time   Pad use 2-3  poise pad   Activities that cause leaking With strong urge;Coughing;Sneezing;Laughing;Lifting;Bending;Walking;Exercising   Urinary urgency Yes   Urinary frequency 10-20 min   Fecal incontinence No   Skin Integrity Erthema  dryness   Pelvic Floor Internal Exam Patient confirm identification and approves PT to  asses muscle integrity   Exam Type Vaginal   Palpation bil. obturator internist; bil. levator ani   Strength fair squeeze, definite lift                  PT Education - 11/28/16 1116    Education provided Yes   Education Details moistrizer, lubricants   Person(s) Educated Patient   Methods Explanation   Comprehension Verbalized understanding          PT Short Term Goals - 11/28/16 1130      PT SHORT TERM GOAL #1   Title independent with initial HEP   Time 4   Period Weeks   Status New     PT SHORT TERM GOAL #2   Title lower abdominal pain decreaed >/= 25% due to understanding relaxation and using pelvic floor meditation   Time 4   Period Weeks   Status New     PT SHORT TERM GOAL #3   Title wears 2-3 pads per day due to reduction in urinary leakage   Time 4   Period Weeks   Status New           PT Long Term Goals - 11/28/16 1132      PT LONG TERM GOAL #1   Title independent with HEP and how to progress herself   Time 4   Period Months   Status New     PT LONG TERM GOAL #2   Title urinary leakage decreased >/= 75% so she is wearing </= 1 light day pad   Time 4   Period Months   Status New     PT LONG TERM GOAL #4   Title lower abdominal pain reduced >/= 60% and patient understands ways to manage her pain    Time 4   Period Months   Status New     PT LONG TERM GOAL #5   Title pelvic floor strength is 4/5 with reduction of redness due to improve moisture in vaginal area   Time 4   Period Months   Status New               Plan - 11/28/16 1117    Clinical Impression Statement Pateint is a 55 year old female with increased urinary leakage and pelvic pain in the past year after she had an allergic reaction to mold.  Patient reports intermitttent pelvic pain at level 3/10 that is increased with stress and not following interstiatal Cystitis diet.  Patient has to urinate every 15-20 min. She has a strong urge with  urination  Pelvic floor  strength is 3/5. Vulva area is dry with redness. Palpable tenderness located in bilateral obturator internist, bilateral levator ani. Tightness is felt in the pelvic floor muscles. Thoracic and lumbar fascia is tight.  Tenderness located in bilateral buttocks, gluteals, and piriformis and right lower quadrant. Patient is a low complex evaluation due to pain is evolving condition and osteoporosis as comorbiditie that will impact care provided. Patient will benefit from skilled therapy to improve strength and reduce pain for improved function.    Rehab Potential Excellent   Clinical Impairments Affecting Rehab Potential osteoporosis   PT Frequency 2x / week   PT Duration Other (comment)  4 months   PT Treatment/Interventions Biofeedback;Electrical Stimulation;Cryotherapy;Ultrasound;Moist Heat;Therapeutic activities;Therapeutic exercise;Neuromuscular re-education;Patient/family education;Manual techniques   PT Next Visit Plan internal soft tissue work, review about moistrizers and lubricants, pelvic floor meditation, stretches   PT Home Exercise Plan progress as needed   Recommended Other Services None   Consulted and Agree with Plan of Care Patient      Patient will benefit from skilled therapeutic intervention in order to improve the following deficits and impairments:  Pain, Decreased strength, Decreased endurance, Decreased activity tolerance, Increased fascial restricitons, Increased muscle spasms, Decreased coordination  Visit Diagnosis: Muscle weakness (generalized) - Plan: PT plan of care cert/re-cert  Unspecified lack of coordination - Plan: PT plan of care cert/re-cert  Other muscle spasm - Plan: PT plan of care cert/re-cert     Problem List Patient Active Problem List   Diagnosis Date Noted  . HYPERLIPIDEMIA-MIXED 03/01/2009  . HYPOTENSION, UNSPECIFIED 03/01/2009    Earlie Counts, PT 11/28/16 11:38 AM   [facility] [address] [place], [place], [address] Phone: [phone]   Fax:  [phone]  Name: Victoria [name] MRN: [id number] Date of Birth: [birth year]

## 2016-11-28 NOTE — Patient Instructions (Signed)
Moisturizers . They are used in the vagina to hydrate the mucous membrane that make up the vaginal canal. . Designed to keep a more normal acid balance (ph) . Once placed in the vagina, it will last between two to three days.  . Use 2-3 times per week at bedtime and last longer than 60 min. . Ingredients to avoid is glycerin and fragrance, can increase chance of infection . Should not be used just before sex due to causing irritation . Most are gels administered either in a tampon-shaped applicator or as a vaginal suppository. They are non-hormonal.   Types of Moisturizers . Replens- drug store . Samul Dada- drug store . Vitamin E vaginal suppositories- Whole foods . Moist Again . Coconut oil- can break down condoms  Things to avoid in the vaginal area . Do not use things to irritate the vulvar area . No lotions . No soaps; can use Aveeno or Calendula cleanser if needed. Must be gentle . No deodorants . No douches . Good to sleep without underwear to let the vaginal area to air out . No scrubbing: spread the lips to let warm water rinse over labias and pat dry Lubrication . Used for intercourse to reduce friction . Avoid ones that have glycerin, warming gels, tingling gels, icing or cooling gel, scented . May need to be reapplied once or several times during sexual activity . Can be applied to both partners genitals prior to vaginal penetration to minimize friction or irritation . Prevent irritation and mucosal tears that cause post coital pain and increased the risk of vaginal and urinary tract infections . Oil-based lubricants cannot be used with condoms due to breaking them down.  Least likely to irritate vaginal tissue.  . Plant based-lubes are safe . Silicone-based lubrication are thicker and last long and used for post-menopausal women Types of Lubricants . Good Clean Love (water based)-[employer], [employer], [employer], [employer] . Slippery Stuff(water based) [employer] . Sylk (water based)  [employer], [place]- drug store; www.blossom-organics.com . Samul Dada- Drug store . Coconut oil- will breakdown condoms, least irritating . Aloe Vera- least irritating . Sliquid Natural H20 (water based)-[employer], good if frequent UTI's . Wet Platinum- (Silicone) Target, [employer] . Yes [place] . [place] Jelly, Replens, and Astroglide kills good Bacteria (lactobadilli)  Things to avoid in the vaginal area . Do not use things to irritate the vulvar area . No lotions . No soaps; can use Aveeno or Calendula cleanser if needed. Must be gentle . No deodorants . No douches . Good to sleep without underwear to let the vaginal area to air out . No scrubbing: spread the lips to let warm water rinse over labias and pat dry  [facility] [address] [place], [place] [address] Phone # [phone] Fax [phone]

## 2016-12-05 ENCOUNTER — Encounter: Payer: Self-pay | Admitting: Physical Therapy

## 2016-12-05 ENCOUNTER — Ambulatory Visit: Payer: BLUE CROSS/BLUE SHIELD | Admitting: Physical Therapy

## 2016-12-05 DIAGNOSIS — M6281 Muscle weakness (generalized): Secondary | ICD-10-CM

## 2016-12-05 DIAGNOSIS — M62838 Other muscle spasm: Secondary | ICD-10-CM

## 2016-12-05 DIAGNOSIS — R279 Unspecified lack of coordination: Secondary | ICD-10-CM

## 2016-12-05 NOTE — Therapy (Signed)
[facility] [address], [place], [place], [address] Phone: [phone]   Fax:  [phone]  Physical Therapy Treatment  Patient Details  Name: Victoria [name] MRN: [id number] Date of Birth: 1961/03/11 Referring Provider: Dr. Dian Queen  Encounter Date: 12/05/2016      PT End of Session - 12/05/16 0928    Visit Number 2   Date for PT Re-Evaluation 03/29/17   PT Start Time 0845   PT Stop Time 0928   PT Time Calculation (min) 43 min   Activity Tolerance Patient tolerated treatment well   Behavior During Therapy [facility] for tasks assessed/performed      Past Medical History:  Diagnosis Date  . Hypertension   . Osteoporosis   . Thyroid disease     Past Surgical History:  Procedure Laterality Date  . bladder distention    . right cyst removed  2012    There were no vitals filed for this visit.      Subjective Assessment - 12/05/16 0849    Subjective No changes.  The vaginal valium is helping me out.    Patient Stated Goals decrease pelvic pain, how to fix incontinence, urinary frequency and urgency   Currently in Pain? Yes   Pain Score 2    Pain Location Abdomen   Pain Orientation Lower   Pain Descriptors / Indicators Dull   Pain Type Chronic pain   Pain Onset Other (comment)  2 years   Pain Frequency Intermittent   Aggravating Factors  going off IC diet, stress, consitpation   Pain Relieving Factors being on IC diet, loose some weight   Multiple Pain Sites No                      Pelvic Floor Special Questions - 12/05/16 0001    Pelvic Floor Internal Exam Patient confirm identification and approves PT to asses muscle integrity   Exam Type Vaginal           [facility] Adult PT Treatment/Exercise - 12/05/16 0001      Self-Care   Self-Care Other Self-Care Comments   Other Self-Care Comments  explain to patient on not bearing down while placing vaginal valium suppository into  vaginal canal.     Manual Therapy   Manual Therapy Internal Pelvic Floor   Internal Pelvic Floor soft tissue work to right obturator internist, puborrectalis, introitus whiel explaining to patient on how to perform at home                PT Education - 12/05/16 0927    Education provided Yes   Education Details guided pelvic floor mediation; stretches, pelvic floor soft tissue work, Radiation protection practitioner) Educated Patient   Methods Explanation;Demonstration;Verbal cues;Handout   Comprehension Returned demonstration;Verbalized understanding          PT Short Term Goals - 12/05/16 0932      PT SHORT TERM GOAL #1   Title independent with initial HEP   Time 4   Period Weeks   Status On-going  just learned     PT SHORT TERM GOAL #2   Title lower abdominal pain decreaed >/= 25% due to understanding relaxation and using pelvic floor meditation   Time 4   Period Weeks   Status On-going     PT SHORT TERM GOAL #3   Title wears 2-3 pads per day due to reduction in urinary leakage   Time 4  Period Weeks   Status On-going           PT Long Term Goals - 11/28/16 1132      PT LONG TERM GOAL #1   Title independent with HEP and how to progress herself   Time 4   Period Months   Status New     PT LONG TERM GOAL #2   Title urinary leakage decreased >/= 75% so she is wearing </= 1 light day pad   Time 4   Period Months   Status New     PT LONG TERM GOAL #4   Title lower abdominal pain reduced >/= 60% and patient understands ways to manage her pain    Time 4   Period Months   Status New     PT LONG TERM GOAL #5   Title pelvic floor strength is 4/5 with reduction of redness due to improve moisture in vaginal area   Time 4   Period Months   Status New               Plan - 12/05/16 0929    Clinical Impression Statement Patient has not met goals due to just starting therapy.  Patient had spasms in pelvic floor muscles. Patient was able to  relax the muscles with soft tissue work.  Patient understands how to put the applicator into the vaginal canal without berating down.  Patient reorts decreased in pain after therapy.  Patient willbenefit from skilled therapy to reduce pain for improved function.    Rehab Potential Excellent   Clinical Impairments Affecting Rehab Potential osteoporosis   PT Frequency 2x / week   PT Duration Other (comment)  4 months   PT Treatment/Interventions Biofeedback;Electrical Stimulation;Cryotherapy;Ultrasound;Moist Heat;Therapeutic activities;Therapeutic exercise;Neuromuscular re-education;Patient/family education;Manual techniques   PT Next Visit Plan internal soft tissue work, happy baby stretch; bulging of pelvic floor   PT Home Exercise Plan progress as needed   Consulted and Agree with Plan of Care Patient      Patient will benefit from skilled therapeutic intervention in order to improve the following deficits and impairments:  Pain, Decreased strength, Decreased endurance, Decreased activity tolerance, Increased fascial restricitons, Increased muscle spasms, Decreased coordination  Visit Diagnosis: Muscle weakness (generalized)  Unspecified lack of coordination  Other muscle spasm     Problem List Patient Active Problem List   Diagnosis Date Noted  . HYPERLIPIDEMIA-MIXED 03/01/2009  . HYPOTENSION, UNSPECIFIED 03/01/2009    Earlie Counts, PT 12/05/16 9:34 AM   [facility] [address] [place], [place], [address] Phone: [phone]   Fax:  [phone]  Name: Victoria [name] MRN: [id number] Date of Birth: Jan 17, 1961

## 2016-12-05 NOTE — Patient Instructions (Addendum)
Butterfly, Supine    Lie on back, feet together. Lower knees toward floor. Hold _30__ seconds. Repeat _2__ times per session. Do _1__ sessions per day.  Copyright  VHI. All rights reserved.  Adductors, Sitting With Hip Flexion    Sit with legs open in a wide V, toes pointing up, hands on knees. Keep spine straight supporting trunk with arms. Slide arms down leg as trunk tips forward. Press knees apart. Hold _30__ seconds. Repeat _2__ times per session. Do __1_ sessions per day.  Copyright  VHI. All rights reserved.  Hook-Lying    Lie with hips and knees bent. Allow body's muscles to relax. Place hands on belly. Inhale slowly and deeply for _3__ seconds, so hands move up. Then take _3__ seconds to exhale. Repeat _10__ times. Do _2__ times a day.   Copyright  VHI. All rights reserved.  Sitting    Sit comfortably. Allow body's muscles to relax. Place hands on belly. Inhale slowly and deeply for 3___ seconds, so hands move out. Then take 3___ seconds to exhale. Repeat _10__ times. Do _2__ times a day.  Copyright  VHI. All rights reserved.  Piriformis Stretch, Sitting    Sit, one ankle on opposite knee, same-side hand on crossed knee. Push down on knee, keeping spine straight. Lean torso forward, with flat back, until tension is felt in hamstrings and gluteals of crossed-leg side. Hold _30__ seconds.  Repeat _2__ times per session. Do __1_ sessions per day.  Copyright  VHI. All rights reserved.  Chair Sitting    Sit at edge of seat, spine straight, one leg extended. Put a hand on each thigh and bend forward from the hip, keeping spine straight. Allow hand on extended leg to reach toward toes. Support upper body with other arm. Hold _30__ seconds. Repeat __2_ times per session. Do _1__ sessions per day.  Copyright  VHI. All rights reserved.   Pelvic floor guided meditation by [employer]. You tube video STRETCHING THE PELVIC FLOOR MUSCLES NO DILATOR   Supplies . Vaginal lubricant . Mirror (optional) . Gloves (optional) Positioning . Start in a semi-reclined position with your head propped up. Bend your knees and place your thumb or finger at the vaginal opening. Procedure . Apply a moderate amount of lubricant on the outer skin of your vagina, the labia minora.  Apply additional lubricant to your finger. Marland Kitchen Spread the skin away from the vaginal opening. Place the end of your finger at the opening. . Do a maximum contraction of the pelvic floor muscles. Tighten the vagina and the anus maximally and relax. . When you know they are relaxed, gently and slowly insert your finger into your vagina, directing your finger slightly downward, for 2-3 inches of insertion. . Relax and stretch the 6 o'clock position . Hold each stretch for _2 min__ and repeat __1_ time with rest breaks of _1__ seconds between each stretch. . Repeat the stretching in the 4 o'clock and 8 o'clock positions. . Total time should be _6__ minutes, _1__ x per day.  Note the amount of theme your were able to achieve and your tolerance to your finger in your vagina. . Once you have accomplished the techniques you may try them in standing with one foot resting on the tub, or in other positions.  This is a good stretch to do in the shower if you don't need to use lubricant.  [facility] [address] [place], [place] [address] Phone # [phone] Fax [phone]

## 2016-12-07 ENCOUNTER — Encounter: Payer: Self-pay | Admitting: Physical Therapy

## 2016-12-07 ENCOUNTER — Ambulatory Visit: Payer: BLUE CROSS/BLUE SHIELD | Admitting: Physical Therapy

## 2016-12-07 DIAGNOSIS — M6281 Muscle weakness (generalized): Secondary | ICD-10-CM | POA: Diagnosis not present

## 2016-12-07 DIAGNOSIS — R279 Unspecified lack of coordination: Secondary | ICD-10-CM

## 2016-12-07 DIAGNOSIS — M62838 Other muscle spasm: Secondary | ICD-10-CM

## 2016-12-07 NOTE — Therapy (Signed)
[facility] [address], [place], [place], [address] Phone: [phone]   Fax:  [phone]  Physical Therapy Treatment  Patient Details  Name: Victoria [name] MRN: [id number] Date of Birth: 10/03/1961 Referring Provider: Dr. Dian Queen  Encounter Date: 12/07/2016      PT End of Session - 12/07/16 0854    Visit Number 3   Date for PT Re-Evaluation 03/29/17   PT Start Time [id number]  came late   PT Stop Time 0930   PT Time Calculation (min) 38 min   Activity Tolerance Patient tolerated treatment well   Behavior During Therapy [facility] for tasks assessed/performed      Past Medical History:  Diagnosis Date  . Hypertension   . Osteoporosis   . Thyroid disease     Past Surgical History:  Procedure Laterality Date  . bladder distention    . right cyst removed  2012    There were no vitals filed for this visit.      Subjective Assessment - 12/07/16 0853    Subjective I was a little sore and felt better.  It will not take long when I learn how to do everything.    Patient Stated Goals decrease pelvic pain, how to fix incontinence, urinary frequency and urgency   Currently in Pain? No/denies                      Pelvic Floor Special Questions - 12/07/16 0001    Pelvic Floor Internal Exam Patient confirm identification and approves PT to asses muscle integrity   Exam Type Vaginal           [facility] Adult PT Treatment/Exercise - 12/07/16 0001      Manual Therapy   Manual Therapy Internal Pelvic Floor   Internal Pelvic Floor soft tissue work to right obturator internist, puborrectalis, introitus, around the urethra with pulling on the baldder                PT Education - 12/07/16 0928    Education provided Yes   Education Details stretches, pelvic drop   Person(s) Educated Patient   Methods Explanation;Demonstration;Verbal cues;Handout   Comprehension Returned  demonstration;Verbalized understanding          PT Short Term Goals - 12/05/16 0932      PT SHORT TERM GOAL #1   Title independent with initial HEP   Time 4   Period Weeks   Status On-going  just learned     PT SHORT TERM GOAL #2   Title lower abdominal pain decreaed >/= 25% due to understanding relaxation and using pelvic floor meditation   Time 4   Period Weeks   Status On-going     PT SHORT TERM GOAL #3   Title wears 2-3 pads per day due to reduction in urinary leakage   Time 4   Period Weeks   Status On-going           PT Long Term Goals - 11/28/16 1132      PT LONG TERM GOAL #1   Title independent with HEP and how to progress herself   Time 4   Period Months   Status New     PT LONG TERM GOAL #2   Title urinary leakage decreased >/= 75% so she is wearing </= 1 light day pad   Time 4   Period Months   Status New     PT LONG TERM  GOAL #4   Title lower abdominal pain reduced >/= 60% and patient understands ways to manage her pain    Time 4   Period Months   Status New     PT LONG TERM GOAL #5   Title pelvic floor strength is 4/5 with reduction of redness due to improve moisture in vaginal area   Time 4   Period Months   Status New               Plan - 12/07/16 [id number]    Clinical Impression Statement Patient felt sore after visit but felt better.  Patient understands the pelvic drop and when she is holding her pelvic floor tight.  Patient will benefit from skilled therapy to reduce pain for improved function.    Rehab Potential Excellent   Clinical Impairments Affecting Rehab Potential osteoporosis   PT Frequency 2x / week   PT Duration Other (comment)  4 months   PT Treatment/Interventions Biofeedback;Electrical Stimulation;Cryotherapy;Ultrasound;Moist Heat;Therapeutic activities;Therapeutic exercise;Neuromuscular re-education;Patient/family education;Manual techniques   PT Next Visit Plan internal soft tissue work, work on urethra and  bladder area; discuss pelvic floor therapist in [place] progress as needed   Consulted and Agree with Plan of Care Patient      Patient will benefit from skilled therapeutic intervention in order to improve the following deficits and impairments:  Pain, Decreased strength, Decreased endurance, Decreased activity tolerance, Increased fascial restricitons, Increased muscle spasms, Decreased coordination  Visit Diagnosis: Muscle weakness (generalized)  Unspecified lack of coordination  Other muscle spasm     Problem List Patient Active Problem List   Diagnosis Date Noted  . HYPERLIPIDEMIA-MIXED 03/01/2009  . HYPOTENSION, UNSPECIFIED 03/01/2009    Earlie Counts, PT 12/07/16 9:32 AM   [facility] [address] [place], [place], [address] Phone: [phone]   Fax:  [phone]  Name: Victoria [name] MRN: [id number] Date of Birth: 01-15-1961

## 2016-12-07 NOTE — Patient Instructions (Addendum)
   1. Position yourself as shown, grabbing onto the feet or behind the knees; you should feel a gentle stretch.  2. Breathe in and allow the pelvic floor muscles to relax.  3. Hold this position for 2-3 minutes.  BACK: Child's Pose (Sciatica)    Sit in knee-chest position and reach arms forward. Separate knees for comfort. Hold position for 30___ breaths. Try to keep knees apart as far as you can.  Repeat _2__ times. Do _1__ times per day.  Copyright  VHI. All rights reserved.  Bear Down    Exhaling, bear down as if to have a bowel movement. Let the pelvic floor melt into the surface you are sitting on.  Repeat _5__ times. Do _2__ times a day.  Copyright  VHI. All rights reserved.   You tube video  The "Pelvic Drop" to Release Pelvic Floor Tension: Three Visualizations  By [employer].  [place] [address] [place], [place] [address] Phone # [phone] Fax [phone]

## 2016-12-12 ENCOUNTER — Encounter: Payer: Self-pay | Admitting: Physical Therapy

## 2016-12-12 ENCOUNTER — Ambulatory Visit: Payer: BLUE CROSS/BLUE SHIELD | Attending: Obstetrics and Gynecology | Admitting: Physical Therapy

## 2016-12-12 DIAGNOSIS — M6281 Muscle weakness (generalized): Secondary | ICD-10-CM | POA: Diagnosis present

## 2016-12-12 DIAGNOSIS — M62838 Other muscle spasm: Secondary | ICD-10-CM | POA: Insufficient documentation

## 2016-12-12 DIAGNOSIS — R279 Unspecified lack of coordination: Secondary | ICD-10-CM | POA: Diagnosis not present

## 2016-12-12 NOTE — Therapy (Signed)
[facility] [address] [place], [place], [address] Phone: [phone]   Fax:  [phone]  Physical Therapy Treatment  Patient Details  Name: Victoria [name] MRN: [id number] Date of Birth: 22-Sep-1961 Referring Provider: Dr. Dian Queen  Encounter Date: 12/12/2016      PT End of Session - 12/12/16 1318    Visit Number 4   Date for PT Re-Evaluation 03/29/17   PT Start Time 1240  came late   PT Stop Time 1314   PT Time Calculation (min) 34 min   Activity Tolerance Patient tolerated treatment well   Behavior During Therapy [facility] for tasks assessed/performed      Past Medical History:  Diagnosis Date  . Hypertension   . Osteoporosis   . Thyroid disease     Past Surgical History:  Procedure Laterality Date  . bladder distention    . right cyst removed  2012    There were no vitals filed for this visit.      Subjective Assessment - 12/12/16 1240    Subjective The internal work has helped. Last night I used the moistrizer. Urinary leakage is better.  I am realizing how dry I am in the vaginal area.    Patient Stated Goals decrease pelvic pain, how to fix incontinence, urinary frequency and urgency   Currently in Pain? No/denies                      Pelvic Floor Special Questions - 12/12/16 0001    Pelvic Floor Internal Exam Patient confirm identification and approves PT to asses muscle integrity   Exam Type Vaginal           [facility] Adult PT Treatment/Exercise - 12/12/16 0001      Manual Therapy   Manual Therapy Internal Pelvic Floor   Internal Pelvic Floor soft tissue work to right obturator internist, puborrectalis, introitus, around the urethra with pulling on the baldder                PT Education - 12/12/16 1317    Education provided No          PT Short Term Goals - 12/12/16 1241      PT SHORT TERM GOAL #1   Title independent with initial HEP   Time 4   Period Weeks   Status Achieved     PT SHORT TERM GOAL #2   Title lower abdominal pain decreaed >/= 25% due to understanding relaxation and using pelvic floor meditation   Time 4   Period Weeks   Status On-going  10% better     PT SHORT TERM GOAL #3   Title wears 2-3 pads per day due to reduction in urinary leakage   Time 4   Period Weeks   Status Achieved  2 pads           PT Long Term Goals - 11/28/16 1132      PT LONG TERM GOAL #1   Title independent with HEP and how to progress herself   Time 4   Period Months   Status New     PT LONG TERM GOAL #2   Title urinary leakage decreased >/= 75% so she is wearing </= 1 light day pad   Time 4   Period Months   Status New     PT LONG TERM GOAL #4   Title lower abdominal pain reduced >/= 60% and patient understands ways  to manage her pain    Time 4   Period Months   Status New     PT LONG TERM GOAL #5   Title pelvic floor strength is 4/5 with reduction of redness due to improve moisture in vaginal area   Time 4   Period Months   Status New               Plan - 12/12/16 1318    Clinical Impression Statement Patient is now down to 2 pads due to urinary leakage is better. Patient is able to do a circular pelvic floor contraction now. Patient lower abdominal pain is 10% better.  Patient will benefit from skilled therapy to reduce pain and improve function.    Clinical Impairments Affecting Rehab Potential osteoporosis   PT Frequency 2x / week   PT Duration Other (comment)  4 months   PT Treatment/Interventions Biofeedback;Electrical Stimulation;Cryotherapy;Ultrasound;Moist Heat;Therapeutic activities;Therapeutic exercise;Neuromuscular re-education;Patient/family education;Manual techniques   PT Next Visit Plan internal soft tissue work, work on Ambulance person and bladder area; work on abdominal scar   PT Home Exercise Plan progress as needed   Consulted and Agree with Plan of Care Patient      Patient will benefit  from skilled therapeutic intervention in order to improve the following deficits and impairments:  Pain, Decreased strength, Decreased endurance, Decreased activity tolerance, Increased fascial restricitons, Increased muscle spasms, Decreased coordination  Visit Diagnosis: Unspecified lack of coordination  Muscle weakness (generalized)  Other muscle spasm     Problem List Patient Active Problem List   Diagnosis Date Noted  . HYPERLIPIDEMIA-MIXED 03/01/2009  . HYPOTENSION, UNSPECIFIED 03/01/2009    Earlie Counts, PT 12/12/16 1:21 PM   [facility] [address], [place], [place], [address] Phone: [phone]   Fax:  [phone]  Name: Victoria [name] MRN: [id number] Date of Birth: 1961/06/26

## 2016-12-14 ENCOUNTER — Ambulatory Visit: Payer: BLUE CROSS/BLUE SHIELD | Admitting: Physical Therapy

## 2016-12-14 ENCOUNTER — Ambulatory Visit
Admission: RE | Admit: 2016-12-14 | Discharge: 2016-12-14 | Disposition: A | Discharge status: Home / Self Care | Payer: BLUE CROSS/BLUE SHIELD | Source: Ambulatory Visit | Attending: Obstetrics and Gynecology | Admitting: Obstetrics and Gynecology

## 2016-12-14 ENCOUNTER — Encounter: Payer: Self-pay | Admitting: Physical Therapy

## 2016-12-14 DIAGNOSIS — M6281 Muscle weakness (generalized): Secondary | ICD-10-CM

## 2016-12-14 DIAGNOSIS — M62838 Other muscle spasm: Secondary | ICD-10-CM

## 2016-12-14 DIAGNOSIS — R279 Unspecified lack of coordination: Secondary | ICD-10-CM | POA: Diagnosis not present

## 2016-12-14 DIAGNOSIS — Z1231 Encounter for screening mammogram for malignant neoplasm of breast: Secondary | ICD-10-CM

## 2016-12-14 NOTE — Therapy (Signed)
[facility] [address], [place], [place], [address] Phone: [phone]   Fax:  [phone]  Physical Therapy Treatment  Patient Details  Name: Victoria [name] MRN: [id number] Date of Birth: [birth year] Referring Provider: Dr. Dian Queen  Encounter Date: 12/14/2016      PT End of Session - 12/14/16 1229    Visit Number 5   Date for PT Re-Evaluation 03/29/17   PT Start Time 1151   PT Stop Time 1229   PT Time Calculation (min) 38 min   Activity Tolerance Patient tolerated treatment well   Behavior During Therapy [facility] for tasks assessed/performed      Past Medical History:  Diagnosis Date  . Hypertension   . Osteoporosis   . Thyroid disease     Past Surgical History:  Procedure Laterality Date  . bladder distention    . right cyst removed  2012    There were no vitals filed for this visit.      Subjective Assessment - 12/14/16 1158    Subjective After last visit I felt good. I have had more leakage this week but I have had increaed stress level.    Patient Stated Goals decrease pelvic pain, how to fix incontinence, urinary frequency and urgency   Currently in Pain? No/denies                         [facility] Adult PT Treatment/Exercise - 12/14/16 0001      Manual Therapy   Manual Therapy Soft tissue mobilization   Soft tissue mobilization abdominal tissue, bilateral quadratus, obliques, transvers abdominus, along the lower abdominal scar                PT Education - 12/14/16 1229    Education provided No          PT Short Term Goals - 12/12/16 1241      PT SHORT TERM GOAL #1   Title independent with initial HEP   Time 4   Period Weeks   Status Achieved     PT SHORT TERM GOAL #2   Title lower abdominal pain decreaed >/= 25% due to understanding relaxation and using pelvic floor meditation   Time 4   Period Weeks   Status On-going  10% better     PT SHORT TERM  GOAL #3   Title wears 2-3 pads per day due to reduction in urinary leakage   Time 4   Period Weeks   Status Achieved  2 pads           PT Long Term Goals - 11/28/16 1132      PT LONG TERM GOAL #1   Title independent with HEP and how to progress herself   Time 4   Period Months   Status New     PT LONG TERM GOAL #2   Title urinary leakage decreased >/= 75% so she is wearing </= 1 light day pad   Time 4   Period Months   Status New     PT LONG TERM GOAL #4   Title lower abdominal pain reduced >/= 60% and patient understands ways to manage her pain    Time 4   Period Months   Status New     PT LONG TERM GOAL #5   Title pelvic floor strength is 4/5 with reduction of redness due to improve moisture in vaginal area   Time 4  Period Months   Status New               Plan - 12/14/16 1231    Clinical Impression Statement Patient has tightness and trigger points along the abdominal muscles.  She felt loser after therapy but was sore from the tissue work.  Patient will be getting a hot pack today.  Patient has not purchased the Associate Professor.  Patient has had increased urinary leakage due to increased stress in her life. Patient will benefit from skilled therapy to reduce pain and improve function.    Rehab Potential Excellent   Clinical Impairments Affecting Rehab Potential osteoporosis   PT Frequency 2x / week   PT Duration Other (comment)  4 months   PT Treatment/Interventions Biofeedback;Electrical Stimulation;Cryotherapy;Ultrasound;Moist Heat;Therapeutic activities;Therapeutic exercise;Neuromuscular re-education;Patient/family education;Manual techniques   PT Next Visit Plan internal soft tissue work, work on Ambulance person and bladder area; work on abdominal scar; go over using the Associate Professor   PT Home Exercise Plan progress as needed   Consulted and Agree with Plan of Care Patient      Patient will benefit from skilled therapeutic intervention in order to  improve the following deficits and impairments:  Pain, Decreased strength, Decreased endurance, Decreased activity tolerance, Increased fascial restricitons, Increased muscle spasms, Decreased coordination  Visit Diagnosis: Unspecified lack of coordination  Muscle weakness (generalized)  Other muscle spasm     Problem List Patient Active Problem List   Diagnosis Date Noted  . HYPERLIPIDEMIA-MIXED 03/01/2009  . HYPOTENSION, UNSPECIFIED 03/01/2009    Earlie Counts, PT 12/14/16 12:35 PM   [facility] [address], [place], [place], [address] Phone: [phone]   Fax:  [phone]  Name: Victoria [name] MRN: [id number] Date of Birth: [birth year]

## 2016-12-19 ENCOUNTER — Encounter: Payer: Self-pay | Admitting: Physical Therapy

## 2016-12-19 ENCOUNTER — Ambulatory Visit: Payer: BLUE CROSS/BLUE SHIELD | Admitting: Physical Therapy

## 2016-12-19 DIAGNOSIS — R279 Unspecified lack of coordination: Secondary | ICD-10-CM | POA: Diagnosis not present

## 2016-12-19 DIAGNOSIS — M62838 Other muscle spasm: Secondary | ICD-10-CM

## 2016-12-19 DIAGNOSIS — M6281 Muscle weakness (generalized): Secondary | ICD-10-CM

## 2016-12-19 NOTE — Therapy (Addendum)
[facility] [address] [place], [place], [address] Phone: [phone]   Fax:  [phone]  Physical Therapy Treatment  Patient Details  Name: Victoria [name] MRN: [id number] Date of Birth: May 06, 1961 Referring Provider: Dr. Dian Queen  Encounter Date: 12/19/2016      PT End of Session - 12/19/16 1455    Visit Number 6   Date for PT Re-Evaluation 03/29/17   PT Start Time 1446   PT Stop Time 1525   PT Time Calculation (min) 39 min   Activity Tolerance Patient tolerated treatment well   Behavior During Therapy [facility] for tasks assessed/performed      Past Medical History:  Diagnosis Date  . Hypertension   . Osteoporosis   . Thyroid disease     Past Surgical History:  Procedure Laterality Date  . bladder distention    . right cyst removed  2012    There were no vitals filed for this visit.      Subjective Assessment - 12/19/16 1451    Subjective I have not gotten the vaginal massage yet. My bladder has been overactive the past 3 days.  Tomorrow I want to join the gym.  I have not started to walk program due to weather has been so bad. My diet affects the bladder. Suprapubically, the tissue is softer than in the past.    Patient Stated Goals decrease pelvic pain, how to fix incontinence, urinary frequency and urgency   Currently in Pain? Yes   Pain Score 3    Pain Location Bladder   Pain Orientation Mid;Lower   Pain Descriptors / Indicators Pressure   Pain Type Chronic pain   Pain Onset Other (comment)  2 years   Pain Frequency Constant   Aggravating Factors  going off IC diet, stress, constipation   Pain Relieving Factors being on IC diet, loos some weight                         [facility] Adult PT Treatment/Exercise - 12/19/16 0001      Manual Therapy   Manual Therapy Myofascial release;Soft tissue mobilization   Soft tissue mobilization left psoas soft tissue    Myofascial Release  urogenital diaphgram release with one hand on sacrum and other on lower abdominal; release if pubovesical ligament on the right and left; release of the suprapubic area above the bladder                PT Education - 12/19/16 1527    Education provided No          PT Short Term Goals - 12/19/16 1455      PT SHORT TERM GOAL #1   Title independent with initial HEP   Time 4   Period Weeks   Status Achieved     PT SHORT TERM GOAL #2   Title lower abdominal pain decreaed >/= 25% due to understanding relaxation and using pelvic floor meditation   Time 4   Period Weeks   Status Achieved  40% better     PT SHORT TERM GOAL #3   Title wears 2-3 pads per day due to reduction in urinary leakage   Time 4   Period Weeks   Status Achieved           PT Long Term Goals - 11/28/16 1132      PT LONG TERM GOAL #1   Title independent with HEP and how  to progress herself   Time 4   Period Months   Status New     PT LONG TERM GOAL #2   Title urinary leakage decreased >/= 75% so she is wearing </= 1 light day pad   Time 4   Period Months   Status New     PT LONG TERM GOAL #4   Title lower abdominal pain reduced >/= 60% and patient understands ways to manage her pain    Time 4   Period Months   Status New     PT LONG TERM GOAL #5   Title pelvic floor strength is 4/5 with reduction of redness due to improve moisture in vaginal area   Time 4   Period Months   Status New               Plan - 12/19/16 1528    Clinical Impression Statement After treatment patient pain decreased by 60%. Patient had increased mobility of lumbar muscles, left psoas, and around the bladder area.  Patient is going to purchase a vaginal massager to learn how to perform self perineal tissue work. Patient will benefit from skilled therapy to reduce pain and improve function.    Rehab Potential Excellent   Clinical Impairments Affecting Rehab Potential osteoporosis   PT Frequency 2x /  week   PT Duration Other (comment)  4 months   PT Treatment/Interventions Biofeedback;Electrical Stimulation;Cryotherapy;Ultrasound;Moist Heat;Therapeutic activities;Therapeutic exercise;Neuromuscular re-education;Patient/family education;Manual techniques   PT Next Visit Plan internal soft tissue work, work on Ambulance person and bladder area; work on abdominal scar; go over using the Associate Professor   PT Home Exercise Plan progress as needed   Consulted and Agree with Plan of Care Patient      Patient will benefit from skilled therapeutic intervention in order to improve the following deficits and impairments:  Pain, Decreased strength, Decreased endurance, Decreased activity tolerance, Increased fascial restricitons, Increased muscle spasms, Decreased coordination  Visit Diagnosis: Unspecified lack of coordination  Muscle weakness (generalized)  Other muscle spasm     Problem List Patient Active Problem List   Diagnosis Date Noted  . HYPERLIPIDEMIA-MIXED 03/01/2009  . HYPOTENSION, UNSPECIFIED 03/01/2009    Earlie Counts, PT 12/19/16 3:31 PM   [facility] [address] [place], [place], [address] Phone: [phone]   Fax:  [phone]  Name: Victoria [name] MRN: [id number] Date of Birth: [birth year]  PHYSICAL THERAPY DISCHARGE SUMMARY  Visits from Start of Care: 6  Current functional level related to goals / functional outcomes: See above.  Patient has not returned since her last visit on 12/19/2016.  Therapist has called patient but no return phone call.    Remaining deficits: See above.   Education / Equipment: HEP  Plan:                                                    Patient goals were partially met. Patient is being discharged due to not returning since the last visit.  Thank you for the referral. Earlie Counts, PT 03/28/17 8:41 AM  ?????

## 2016-12-20 ENCOUNTER — Ambulatory Visit: Payer: Self-pay

## 2016-12-21 ENCOUNTER — Encounter: Payer: BLUE CROSS/BLUE SHIELD | Admitting: Physical Therapy

## 2016-12-22 ENCOUNTER — Encounter: Payer: Self-pay | Admitting: Gastroenterology

## 2017-11-05 ENCOUNTER — Ambulatory Visit: Payer: BLUE CROSS/BLUE SHIELD | Attending: Obstetrics and Gynecology | Admitting: Physical Therapy

## 2017-11-05 DIAGNOSIS — M6281 Muscle weakness (generalized): Secondary | ICD-10-CM | POA: Diagnosis present

## 2017-11-05 DIAGNOSIS — R279 Unspecified lack of coordination: Secondary | ICD-10-CM | POA: Diagnosis present

## 2017-11-05 DIAGNOSIS — M62838 Other muscle spasm: Secondary | ICD-10-CM | POA: Insufficient documentation

## 2017-11-05 NOTE — Patient Instructions (Addendum)
Moisturizers . They are used in the vagina to hydrate the mucous membrane that make up the vaginal canal. . Designed to keep a more normal acid balance (ph) . Once placed in the vagina, it will last between two to three days.  . Use 2-3 times per week at bedtime and last longer than 60 min. . Ingredients to avoid is glycerin and fragrance, can increase chance of infection . Should not be used just before sex due to causing irritation . Most are gels administered either in a tampon-shaped applicator or as a vaginal suppository. They are non-hormonal.   Types of Moisturizers . Samul Dada- drug store . Vitamin E vaginal suppositories- Whole foods, Amazon . Moist Again . Coconut oil- can break down condoms . Michail Jewels . Yes moisturizer- amazon . NeuEve Silk , NeuEve Silver for menopausal or over 65 (if have severe vaginal atrophy or cancer treatments use NeuEve Silk for  1 month than move to The Pepsi)- [employer], MapleFlower.dk . Olive and Bee intimate cream- www.oliveandbee.com.au  Creams to use externally on the Vulva area  Albertson's (good for for cancer patients that had radiation to the area)- [place] or [employer].FlyingBasics.com.br  V-magic cream - amazon  Julva-amazon  Vital "V Wild Yam salve ( help moisturize and help with thinning vulvar area, does have Sharon   Things to avoid in the vaginal area . Do not use things to irritate the vulvar area . No lotions just specialized creams for the vulva area- Neogyn, V-magic, No soaps; can use Aveeno or Calendula cleanser if needed. Must be gentle . No deodorants . No douches . Good to sleep without underwear to let the vaginal area to air out . No scrubbing: spread the lips to let warm water rinse over labias and pat dry   SVAKOM Vibrators Adult Toys for Women - Sex Environmental consultant underwear that comes above the  scar  STRETCHING THE PELVIC FLOOR MUSCLES NO DILATOR  Supplies . Vaginal lubricant . Mirror (optional) . Gloves (optional) Positioning . Start in a semi-reclined position with your head propped up. Bend your knees and place your thumb or finger at the vaginal opening. Procedure . Apply a moderate amount of lubricant on the outer skin of your vagina, the labia minora.  Apply additional lubricant to your finger. Marland Kitchen Spread the skin away from the vaginal opening. Place the end of your finger at the opening. . Do a maximum contraction of the pelvic floor muscles. Tighten the vagina and the anus maximally and relax. . When you know they are relaxed, gently and slowly insert your finger into your vagina, directing your finger slightly downward, for 2-3 inches of insertion. . Relax and stretch the 6 o'clock position . Hold each stretch for _2 min__ and repeat __1_ time with rest breaks of _1__ seconds between each stretch. . Repeat the stretching in the 4 o'clock and 8 o'clock positions. . Total time should be _6__ minutes, _1__ x per day.  Note the amount of theme your were able to achieve and your tolerance to your finger in your vagina. . Once you have accomplished the techniques you may try them in standing with one foot resting on the tub, or in other positions.  This is a good stretch to do in the shower if you don't need to use lubricant.  [facility] [address] [place], [place]  [place] Phone # [phone] Fax [phone]

## 2017-11-05 NOTE — Therapy (Signed)
[facility] [address] [place], [place], [address] Phone: [phone]   Fax:  [phone]  Physical Therapy Evaluation  Patient Details  Name: Steph Cheadle MRN: [id number] Date of Birth: May 17, 1961 Referring Provider: Dr. Dian Queen   Encounter Date: 11/05/2017  PT End of Session - 11/05/17 1504    Visit Number  1    Date for PT Re-Evaluation  11/19/17    PT Start Time  1445    PT Stop Time  1525    PT Time Calculation (min)  40 min    Activity Tolerance  Patient tolerated treatment well    Behavior During Therapy  [facility] for tasks assessed/performed       Past Medical History:  Diagnosis Date  . Hypertension   . Osteoporosis   . Thyroid disease     Past Surgical History:  Procedure Laterality Date  . bladder distention    . right cyst removed  2012    There were no vitals filed for this visit.   Subjective Assessment - 11/05/17 1447    Subjective  Patient will be movint to [place] on 11/19/2017. I am under stress. I feel tight in lower abdominal and leaking alot.  I feel I need to get the vibrator. I had foul discharge and not sure I have a UTI.     Patient Stated Goals  reduce spasms, understand exercises and strength    Currently in Pain?  Yes    Pain Score  4     Pain Location  Abdomen    Pain Orientation  Lower    Pain Descriptors / Indicators  Dull    Pain Type  Chronic pain    Pain Onset  More than a month ago    Pain Frequency  Constant    Aggravating Factors   stress, eating foods that irritate the bladder    Pain Relieving Factors  less stress    Multiple Pain Sites  No         [facility] PT Assessment - 11/05/17 0001      Assessment   Medical Diagnosis  Pelvic floor dysfunction N81.89    Referring Provider  Dr. Dian Queen    Onset Date/Surgical Date  11/05/16    Prior Therapy  yes in the past      Restrictions   Weight Bearing Restrictions  No      Balance Screen   Has  the patient fallen in the past 6 months  No    Has the patient had a decrease in activity level because of a fear of falling?   No    Is the patient reluctant to leave their home because of a fear of falling?   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    Pelvic Floor Special Questions - 11/05/17 0001    Prior Pregnancies  Yes    Number of C-Sections  2    Urinary Leakage  Yes    Pad use  2    Activities that cause leaking  Other    Other activities that cause leaking  eating    Pelvic Floor Internal Exam  patient confirms identification and approves PT to assess pelvic floor muscle integrity    Exam Type  Vaginal                    PT Long Term Goals - 11/05/17 1528      PT LONG TERM GOAL #1   Title  independent with HEP and how to progress herself    Time  2    Period  Weeks    Status  New    Target Date  11/19/17      PT LONG TERM GOAL #2   Title  understand how to moisturize the vaginal area to reduce dryness and improve health of vaginal tissue    Time  2    Period  Weeks    Status  New    Target Date  11/19/17      PT LONG TERM GOAL #3   Title  understand how to perform soft tissue work to the pelvic floor muscles using a vaginal massager to improve blood flow to the tissue and reduce pain    Time  2    Period  Weeks    Target Date  11/19/17      PT LONG TERM GOAL #4   Title  understand when it is appropriate to strengthen  the pelvic floor muscles so she does not increase the muscle tension and increase pain    Time  2    Period  Weeks    Status  New    Target Date  11/19/17      PT LONG TERM GOAL #5   Title  -------             Plan - 11/05/17 1518    Clinical Impression Statement  Patient is a 56 year old female with pelvic floor dysfunction and pain.  Patient reports constant lower abdominal pain at level 5/10 that is aggravated with different foods and  stress.  Patient reports urinary leakage and wears 2 pads per day.  Patient reports urinary leakage with activity and different foods she eats.  Pelvic floor strength is 2/5 with no lift. Tightness and tenderness in pelvic floor muscles and decreased mobility of urethra spincter.  Decreased mobility of c-section scar.  Patient is moving to [place] on 11/19/2017.  Patient will benefit from skilled therapy to educate patient on how to manage her pelvic floor pain, releasing the muscle tightness, when to start strengtheing.     History and Personal Factors relevant to plan of care:  IC, 2 c-sections    Clinical Presentation  Stable    Clinical Presentation due to:  stable condition    Clinical Decision Making  Low    Rehab Potential  Good    Clinical Impairments Affecting Rehab Potential  None    PT Frequency  3x / week    PT Duration  2 weeks    PT Treatment/Interventions  Biofeedback;Therapeutic exercise;Therapeutic activities;Neuromuscular re-education;Patient/family education;Scar mobilization;Manual techniques;Dry needling;Moist Heat;Cryotherapy    PT Next Visit Plan  lubricants; hip stretches; bulging of pelvic floor; you tube;  IC diet; abdominal massage    PT Home Exercise Plan  progress as needed    Consulted and Agree with Plan of Care  Patient       Patient will benefit from skilled therapeutic intervention in order to improve the following deficits and impairments:  Increased fascial restricitons, Pain, Decreased scar mobility, Impaired  tone, Increased muscle spasms, Decreased strength  Visit Diagnosis: Muscle weakness (generalized) - Plan: PT plan of care cert/re-cert  Other muscle spasm - Plan: PT plan of care cert/re-cert     Problem List Patient Active Problem List   Diagnosis Date Noted  . HYPERLIPIDEMIA-MIXED 03/01/2009  . HYPOTENSION, UNSPECIFIED 03/01/2009    Earlie Counts, PT 11/05/17 3:33 PM   [facility] [address] [place], [place], [address] Phone: [phone]   Fax:  [phone]  Name: Coralie Stanke MRN: [id number] Date of Birth: 04-27-1961

## 2017-11-08 ENCOUNTER — Encounter: Payer: Self-pay | Admitting: Physical Therapy

## 2017-11-08 ENCOUNTER — Ambulatory Visit: Payer: BLUE CROSS/BLUE SHIELD | Admitting: Physical Therapy

## 2017-11-08 DIAGNOSIS — R279 Unspecified lack of coordination: Secondary | ICD-10-CM

## 2017-11-08 DIAGNOSIS — M6281 Muscle weakness (generalized): Secondary | ICD-10-CM | POA: Diagnosis not present

## 2017-11-08 DIAGNOSIS — M62838 Other muscle spasm: Secondary | ICD-10-CM

## 2017-11-08 NOTE — Patient Instructions (Addendum)
Lubrication . Used for intercourse to reduce friction . Avoid ones that have glycerin, warming gels, tingling gels, icing or cooling gel, scented . Avoid parabens due to a preservative similar to female sex hormone . May need to be reapplied once or several times during sexual activity . Can be applied to both partners genitals prior to vaginal penetration to minimize friction or irritation . Prevent irritation and mucosal tears that cause post coital pain and increased the risk of vaginal and urinary tract infections . Oil-based lubricants cannot be used with condoms due to breaking them down.  Least likely to irritate vaginal tissue.  . Plant based-lubes are safe . Silicone-based lubrication are thicker and last long and used for post-menopausal women  Vaginal Lubricators Here is a list of some suggested lubricators you can use for intercourse. Use the most hypoallergenic product.  You can place on you or your partner.   Slippery Stuff  Sylk or Sliquid Natural H2O ( good  if frequent UTI's)  Blossom Organics (www.blossom-organics.com)  Luvena   Coconut oil  PJur Woman Nude- water based lubricant, amazon  Uberlube- Amazon  Aloe Vera  Yes lubricant- [employer] Platinum-Silicone, Target, [employer]  Olive and Bee intimate cream-  www.oliveandbee.com.au Things to avoid in lubricants are glycerin, warming gels, tingling gels, icing or cooling  gels, and scented gels.  Also avoid Vaseline. KY jelly, Replens, and Astroglide kills good bacteria(lactobacilli)  Things to avoid in the vaginal area . Do not use things to irritate the vulvar area . No lotions- see below . No soaps; can use Aveeno or Calendula cleanser if needed. Must be gentle . No deodorants . No douches . Good to sleep without underwear to let the vaginal area to air out . No scrubbing: spread the lips to let warm water rinse over labias and pat dry  Creams that can be used on the Ragan Releveum or [employer] Gele    About Abdominal Massage  Abdominal massage, also called external colon massage, is a self-treatment circular massage technique that can reduce and eliminate gas and ease constipation. The colon naturally contracts in waves in a clockwise direction starting from inside the right hip, moving up toward the ribs, across the belly, and down inside the left hip.  When you perform circular abdominal massage, you help stimulate your colon's normal wave pattern of movement called peristalsis.  It is most beneficial when done after eating.  Positioning You can practice abdominal massage with oil while lying down, or in the shower with soap.  Some people find that it is just as effective to do the massage through clothing while sitting or standing.  How to Massage Start by placing your finger tips or knuckles on your right side, just inside your hip bone.  . Make small circular movements while you move upward toward your rib cage.   . Once you reach the bottom right side of your rib cage, take your circular movements across to the left side of the bottom of your rib cage.  . Next, move downward until you reach the inside of your left hip bone.  This is the path your feces travel in your colon. . Continue to perform your abdominal massage in this pattern for 10 minutes each day.     You can apply as much pressure as is comfortable in your  massage.  Start gently and build pressure as you continue to practice.  Notice any areas of pain as you massage; areas of slight pain may be relieved as you massage, but if you have areas of significant or intense pain, consult with your healthcare provider.  Other Considerations . General physical activity including bending and stretching can have a beneficial massage-like effect on the colon.  Deep breathing can also  stimulate the colon because breathing deeply activates the same nervous system that supplies the colon.   . Abdominal massage should always be used in combination with a bowel-conscious diet that is high in the proper type of fiber for you, fluids (primarily water), and a regular exercise program. [facility] [address] [place], [place] [address] Phone # [phone] Fax [phone]

## 2017-11-08 NOTE — Therapy (Signed)
[facility] [address] [place], [place], [address] Phone: [phone]   Fax:  [phone]  Physical Therapy Treatment  Patient Details  Name: Victoria [name] MRN: [id number] Date of Birth: 12-19-1960 Referring Provider: Dr. Dian Queen   Encounter Date: 11/08/2017  PT End of Session - 11/08/17 0931    Visit Number  2    Date for PT Re-Evaluation  11/19/17    PT Start Time  8676 came late    PT Stop Time  0930    PT Time Calculation (min)  33 min    Activity Tolerance  Patient tolerated treatment well    Behavior During Therapy  [facility] for tasks assessed/performed       Past Medical History:  Diagnosis Date  . Hypertension   . Osteoporosis   . Thyroid disease     Past Surgical History:  Procedure Laterality Date  . bladder distention    . right cyst removed  2012    There were no vitals filed for this visit.  Subjective Assessment - 11/08/17 0859    Subjective  Patient brought her vaginal massager.     Patient Stated Goals  reduce spasms, understand exercises and strength    Currently in Pain?  Yes    Pain Score  4     Pain Location  Abdomen    Pain Orientation  Lower    Pain Descriptors / Indicators  Dull    Pain Type  Chronic pain    Pain Onset  More than a month ago    Pain Frequency  Constant    Aggravating Factors   stress, eating foods that irritate the bladder    Pain Relieving Factors  less stress    Multiple Pain Sites  No                      [facility] Adult PT Treatment/Exercise - 11/08/17 0001      Self-Care   Self-Care  --    Other Self-Care Comments   roll ball under foot      Manual Therapy   Manual Therapy  Soft tissue mobilization    Manual therapy comments  how to use the vaginal massager to release tightness in the pelvic floor muscles in reclined position    Soft tissue mobilization  circular abdominal massage; soft tissue work to lower abdominal to release  tight fascia             PT Education - 11/08/17 0917    Education provided  Yes    Education Details  lubricants; abominal massage; how to perform vaginal massage with massager    Person(s) Educated  Patient    Methods  Explanation;Demonstration;Verbal cues;Handout    Comprehension  Returned demonstration;Verbalized understanding          PT Long Term Goals - 11/05/17 1528      PT LONG TERM GOAL #1   Title  independent with HEP and how to progress herself    Time  2    Period  Weeks    Status  New    Target Date  11/19/17      PT LONG TERM GOAL #2   Title  understand how to moisturize the vaginal area to reduce dryness and improve health of vaginal tissue    Time  2    Period  Weeks    Status  New    Target Date  11/19/17  PT LONG TERM GOAL #3   Title  understand how to perform soft tissue work to the pelvic floor muscles using a vaginal massager to improve blood flow to the tissue and reduce pain    Time  2    Period  Weeks    Target Date  11/19/17      PT LONG TERM GOAL #4   Title  understand when it is appropriate to strengthen the pelvic floor muscles so she does not increase the muscle tension and increase pain    Time  2    Period  Weeks    Status  New    Target Date  11/19/17      PT LONG TERM GOAL #5   Title  -------            Plan - 11/08/17 0931    Clinical Impression Statement  Patient has learned how to use the vagianl moisturizer.  Patient understands how to use a vaginal moisturizer correctly and when to use the vibrating component to relax the tissue. Patient was given a list of items she is able to use for moisturizer and lubricant.  Patient will benefit from skilled therapy  to educated patient on how to manage her pelvic floor  pain releasing the muscle tightness and when to start strenthening.     Rehab Potential  Good    Clinical Impairments Affecting Rehab Potential  None    PT Frequency  3x / week    PT Duration  2 weeks     PT Treatment/Interventions  Biofeedback;Therapeutic exercise;Therapeutic activities;Neuromuscular re-education;Patient/family education;Scar mobilization;Manual techniques;Dry needling;Moist Heat;Cryotherapy    PT Next Visit Plan   hip stretches; bulging of pelvic floor; you tube; IC diet    PT Home Exercise Plan  progress as needed    Recommended Other Services  MD signed initial note    Consulted and Agree with Plan of Care  Patient       Patient will benefit from skilled therapeutic intervention in order to improve the following deficits and impairments:  Increased fascial restricitons, Pain, Decreased scar mobility, Impaired tone, Increased muscle spasms, Decreased strength  Visit Diagnosis: Muscle weakness (generalized)  Other muscle spasm  Unspecified lack of coordination     Problem List Patient Active Problem List   Diagnosis Date Noted  . HYPERLIPIDEMIA-MIXED 03/01/2009  . HYPOTENSION, UNSPECIFIED 03/01/2009    Earlie Counts, PT 11/08/17 10:24 AM   [facility] [address] [place], [place], [address] Phone: [phone]   Fax:  [phone]  Name: Victoria [name] MRN: [id number] Date of Birth: 26-Feb-1961

## 2017-11-14 ENCOUNTER — Encounter: Payer: Self-pay | Admitting: Physical Therapy

## 2017-11-14 ENCOUNTER — Ambulatory Visit: Payer: BLUE CROSS/BLUE SHIELD | Attending: Obstetrics and Gynecology | Admitting: Physical Therapy

## 2017-11-14 DIAGNOSIS — R279 Unspecified lack of coordination: Secondary | ICD-10-CM | POA: Diagnosis present

## 2017-11-14 DIAGNOSIS — M6281 Muscle weakness (generalized): Secondary | ICD-10-CM | POA: Diagnosis not present

## 2017-11-14 DIAGNOSIS — M62838 Other muscle spasm: Secondary | ICD-10-CM | POA: Diagnosis present

## 2017-11-14 NOTE — Therapy (Signed)
[facility] [address], [place], [place], [address] Phone: [phone]   Fax:  [phone]  Physical Therapy Treatment  Patient Details  Name: Vernessa Likes MRN: [id number] Date of Birth: 1961-06-18 Referring Provider: Dr. Dian Queen   Encounter Date: 11/14/2017  PT End of Session - 11/14/17 1245    Visit Number  3    Date for PT Re-Evaluation  11/19/17    PT Start Time  1240 Patient came late    PT Stop Time  1318    PT Time Calculation (min)  38 min    Activity Tolerance  Patient tolerated treatment well    Behavior During Therapy  [facility] for tasks assessed/performed       Past Medical History:  Diagnosis Date  . Hypertension   . Osteoporosis   . Thyroid disease     Past Surgical History:  Procedure Laterality Date  . bladder distention    . right cyst removed  2012    There were no vitals filed for this visit.  Subjective Assessment - 11/14/17 1243    Subjective  I have used the massager. I have not used it in the last few days.  After using the massager there is not difference in pain. Patient has hit some tender spots. When I am not constipated there is less pressure on the bladder.     Patient Stated Goals  reduce spasms, understand exercises and strength    Currently in Pain?  Yes    Pain Score  3     Pain Location  Abdomen    Pain Orientation  Lower    Pain Descriptors / Indicators  Dull    Pain Type  Chronic pain    Pain Onset  More than a month ago    Pain Frequency  Constant    Aggravating Factors   stress, eating foods that irritate the bladder    Pain Relieving Factors  less stress                              PT Education - 11/14/17 1313    Education provided  Yes    Education Details  stretches and ball exercises.     Person(s) Educated  Patient    Methods  Explanation;Demonstration;Verbal cues;Handout    Comprehension  Verbalized understanding;Returned  demonstration          PT Long Term Goals - 11/05/17 1528      PT LONG TERM GOAL #1   Title  independent with HEP and how to progress herself    Time  2    Period  Weeks    Status  New    Target Date  11/19/17      PT LONG TERM GOAL #2   Title  understand how to moisturize the vaginal area to reduce dryness and improve health of vaginal tissue    Time  2    Period  Weeks    Status  New    Target Date  11/19/17      PT LONG TERM GOAL #3   Title  understand how to perform soft tissue work to the pelvic floor muscles using a vaginal massager to improve blood flow to the tissue and reduce pain    Time  2    Period  Weeks    Target Date  11/19/17      PT LONG TERM GOAL #  4   Title  understand when it is appropriate to strengthen the pelvic floor muscles so she does not increase the muscle tension and increase pain    Time  2    Period  Weeks    Status  New    Target Date  11/19/17      PT LONG TERM GOAL #5   Title  -------            Plan - 11/14/17 1314    Clinical Impression Statement  Patient is being consistent with her exercises. Patient understands her flexibilty and ball exercises. Patient is leaving for [place] on 11/17/2017 and will be living there.  Patient will come 1 more visit to finalize her pelvic floor education.     Rehab Potential  Good    Clinical Impairments Affecting Rehab Potential  None    PT Frequency  3x / week    PT Duration  2 weeks    PT Treatment/Interventions  Biofeedback;Therapeutic exercise;Therapeutic activities;Neuromuscular re-education;Patient/family education;Scar mobilization;Manual techniques;Dry needling;Moist Heat;Cryotherapy    PT Next Visit Plan  how to be return to being sexually active; discharge next visit    PT Home Exercise Plan  progress as needed    Consulted and Agree with Plan of Care  Patient       Patient will benefit from skilled therapeutic intervention in order to improve the following deficits and  impairments:  Increased fascial restricitons, Pain, Decreased scar mobility, Impaired tone, Increased muscle spasms, Decreased strength  Visit Diagnosis: Muscle weakness (generalized)  Other muscle spasm  Unspecified lack of coordination     Problem List Patient Active Problem List   Diagnosis Date Noted  . HYPERLIPIDEMIA-MIXED 03/01/2009  . HYPOTENSION, UNSPECIFIED 03/01/2009    Earlie Counts, PT 11/14/17 1:23 PM    [facility] [address], [place], [place], [address] Phone: [phone]   Fax:  [phone]  Name: Denitra Donaghey MRN: [id number] Date of Birth: 1961/08/21

## 2017-11-14 NOTE — Patient Instructions (Addendum)
Butterfly, Supine    Lie on back, feet together. Lower knees toward floor. Hold _30__ seconds. Repeat _2__ times per session. Do _1__ sessions per day.  Copyright  VHI. All rights reserved.  Supine Knee-to-Chest, Bilateral    Lie on back, hands clasped behind both knees. Pull knees in toward chest until a comfortable stretch is felt in lower back and buttocks. Hold _30__ seconds. Repeat _1__ times per session. Do __1_ sessions per day.  Copyright  VHI. All rights reserved.    1. Position yourself as shown, grabbing onto the feet or behind the knees; you should feel a gentle stretch.  2. Breathe in and allow the pelvic floor muscles to relax.  3. Hold this position for 2-3 minutes.  Extensors / Rotators, Supine    Lie supine, one leg straight, other leg bent, knee held by opposite hand. Pull leg across body toward floor. Hold _30__ seconds.  Repeat __1_ times per session. Do __1_ sessions per day.  Copyright  VHI. All rights reserved.   BACK: Child's Pose (Sciatica)    Sit in knee-chest position and reach arms forward. Separate knees for comfort. Hold position for _30__ breaths. Repeat __2_ times. Do __1_ times per day.  Copyright  VHI. All rights reserved.  Mid-Back Rotation Stretch    Reach to each side as far as possible, keeping chest low to floor. Hold _30___ seconds. Repeat ___1_ times per set. Do __1__ sets per session. Do __1__ sessions per day.  http://orth.exer.us/132   Copyright  VHI. All rights reserved.  Press-Up    Press upper body upward, keeping hips in contact with floor. Keep lower back and buttocks relaxed. Hold __2__ seconds. Repeat __5__ times per set. Do __1__ sets per session. Do ___1_ sessions per day.  http://orth.exer.us/94   Copyright  VHI. All rights reserved.  Outer Hip Stretch: Pigeon Pose (If No Knee Complications)    Pelvic tilt engaged, adjust angle of front shin for less deep stretch. Lean forward until elbows touch  mat. Hold for _30___ breaths. Repeat _1___ times each leg.  Copyright  VHI. All rights reserved.   Cat / Cow Flow    Inhale, press spine toward ceiling like a Halloween cat. Keeping strength in arms and abdominals, exhale to soften spine through neutral and into cow pose. Open chest and arch back. Initiate movement between cat and cow at tailbone, one vertebrae at a time. Repeat _15___ times.  Copyright  VHI. All rights reserved.   sit on physioball: roll hips side to side, roll hips forward and back, make circles with hips, Seated Alternating Leg Raise (Marching)    Sit on ball. Raise bent knee and return. Repeat with other leg. Do _1__ sets of _15__ repetitions.  Copyright  VHI. All rights reserved.  Seated Leg Extension    Sit on ball. Straighten knee and return. Repeat with other leg. Do _1__ sets of __15_ repetitions.  Copyright  VHI. All rights reserved.  Seated Alternating Arm Raise    Sit on ball. Raise one arm above head and return. Repeat with other arm. Do __1_ sets of _15__ repetitions.  Copyright  VHI. All rights reserved.  Seated Opposite-Leg / Arm    Sit on ball. Raise one leg and the opposite arm. Repeat with other limbs. Hold each position _1__ seconds. Do _1__ alternating sets of _15__ repetitions. Advanced: Add hand weights.  Copyright  VHI. All rights reserved.  Bridge With [employer] on back, both legs on  top of ball, knees slightly flexed, arms on floor. Tighten buttocks while keeping abdominals tight and raise hips __5_ inches off floor. Do __1_ sets of __15_ repetitions. Advanced: Do not use arms for support.  Copyright  VHI. All rights reserved.  [facility] [address] [place], [place] [address] Phone # [phone] Fax [phone]

## 2017-11-15 ENCOUNTER — Encounter: Payer: Self-pay | Admitting: Physical Therapy

## 2017-11-15 ENCOUNTER — Ambulatory Visit: Payer: BLUE CROSS/BLUE SHIELD | Admitting: Physical Therapy

## 2017-11-15 DIAGNOSIS — M62838 Other muscle spasm: Secondary | ICD-10-CM

## 2017-11-15 DIAGNOSIS — M6281 Muscle weakness (generalized): Secondary | ICD-10-CM

## 2017-11-15 DIAGNOSIS — R279 Unspecified lack of coordination: Secondary | ICD-10-CM

## 2017-11-15 NOTE — Therapy (Signed)
[facility] [address] [place], [place], [address] Phone: [phone]   Fax:  [phone]  Physical Therapy Treatment  Patient Details  Name: Victoria [name] MRN: [id number] Date of Birth: [birth year] Referring Provider: Dr. Dian Queen   Encounter Date: 11/15/2017  PT End of Session - 11/15/17 1708    Visit Number  4    Date for PT Re-Evaluation  11/19/17    PT Start Time  1622    PT Stop Time  1700    PT Time Calculation (min)  38 min    Activity Tolerance  Patient tolerated treatment well    Behavior During Therapy  [facility] for tasks assessed/performed       Past Medical History:  Diagnosis Date  . Hypertension   . Osteoporosis   . Thyroid disease     Past Surgical History:  Procedure Laterality Date  . bladder distention    . right cyst removed  2012    There were no vitals filed for this visit.  Subjective Assessment - 11/15/17 1627    Subjective  No changes since yesterday. Today is last day.     Patient Stated Goals  reduce spasms, understand exercises and strength         [facility] PT Assessment - 11/15/17 0001      Assessment   Medical Diagnosis  Pelvic floor dysfunction N81.89    Referring Provider  Dr. 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      Cognition   Overall Cognitive Status  Within Functional Limits for tasks assessed      Strength   Overall Strength Comments  abdominals 2/5 with pouching outward      Palpation   Palpation comment  thickness over c-section scar               Pelvic Floor Special Questions - 11/15/17 0001    Number of C-Sections  2    Urinary Leakage  Yes    Pad use  2    Activities that cause leaking  Other    Other activities that cause leaking  eating        [facility] Adult PT Treatment/Exercise - 11/15/17 0001      Manual Therapy   Manual Therapy  Soft tissue mobilization    Soft tissue mobilization  scar massage to lower abdomen             PT Education - 11/15/17 1659    Education provided  Yes    Education Details  how to use a dilator, review HEP, Dilators and which ones are good, IC diet, printed out all home program again for patient    Person(s) Educated  Patient    Methods  Explanation;Demonstration;Handout    Comprehension  Verbalized understanding;Returned demonstration          PT Long Term Goals - 11/15/17 1704      PT LONG TERM GOAL #1   Title  independent with HEP and how to progress herself    Time  2    Period  Weeks    Status  Achieved      PT LONG TERM GOAL #2   Title  understand how to moisturize the vaginal area to reduce dryness and improve health of vaginal tissue    Time  2    Period  Weeks    Status  Achieved      PT LONG TERM GOAL #3   Title  understand how to perform soft tissue work to the pelvic floor muscles using a vaginal massager to improve blood flow to the tissue and reduce pain    Time  2    Period  Weeks    Status  Achieved      PT LONG TERM GOAL #4   Title  understand when it is appropriate to strengthen the pelvic floor muscles so she does not increase the muscle tension and increase pain    Time  2    Period  Weeks    Status  Achieved            Plan - 11/15/17 1706    Clinical Impression Statement  Patient has met her goals. She is moving to [place] on Saturday.  She is well educated on using vaginal dilators to expand the introitus for future sexual partner, good vaginal health, how to moisturize the vaginal tissue to reduce effects of decreased estrogen, hip and pelvic floor stretchs, C-section scar massage and how to massage the pelvic floor muscles.     Rehab  Potential  Good    Clinical Impairments Affecting Rehab Potential  None    PT Treatment/Interventions  Biofeedback;Therapeutic exercise;Therapeutic activities;Neuromuscular re-education;Patient/family education;Scar mobilization;Manual techniques;Dry needling;Moist Heat;Cryotherapy    PT Next Visit Plan  Discharge to HEP this visit    PT Home Exercise Plan  Current HEP    Consulted and Agree with Plan of Care  Patient       Patient will benefit from skilled therapeutic intervention in order to improve the following deficits and impairments:  Increased fascial restricitons, Pain, Decreased scar mobility, Impaired tone, Increased muscle spasms, Decreased strength  Visit Diagnosis: Muscle weakness (generalized)  Other muscle spasm  Unspecified lack of coordination     Problem List Patient Active Problem List   Diagnosis Date Noted  . HYPERLIPIDEMIA-MIXED 03/01/2009  . HYPOTENSION, UNSPECIFIED 03/01/2009    Earlie Counts, PT 11/15/17 5:10 PM   [facility] [address] [place], [place], [address] Phone: [phone]   Fax:  [phone]  Name: Victoria [name] MRN: [id number]   PHYSICAL THERAPY DISCHARGE SUMMARY  Visits from Start of Care: 4  Current functional level related to goals / functional outcomes: See above   Remaining deficits: See above   Education / Equipment: HEP Plan: Patient agrees to discharge.  Patient goals were met. Patient is being discharged due to meeting the stated rehab goals.  Thank you for the referral.Victoria [name], PT 11/15/17 5:10 PM  ?????      Date of Birth: [birth year]

## 2017-11-15 NOTE — Patient Instructions (Addendum)
PROTOCOL FOR DILATORS   1. Wash dilator with soap and water prior to insertion.    2. Lay on your back reclined. Knees are to be up and apart while on your bed or in the bathtub with warm water.   3. Lubricate the end of the dilator with a water-soluble lubricant.  4. Separate the labia.   5. Tense the pelvic floor muscles than relax; while relaxing, slide lubricated dilator into the vagina.    6. Tense muscles again while holding the dilator so it does not get pushed out; relax and slide it in a little further.   7. Try blowing out as if filling a balloon; this may relax the muscles and allow penetration.  Repeat blowing out to insert dilator further.  8. Keep dilator in for 5-10 minutes if tolerate, with the pelvic floor muscles relaxed to further stretch the canal. No more than 3/10 pain.  9. Move dilator in/out, also move hips in different directions, and just move the dilators to stretch the muscles.   10. Never force the dilator into the canal.  11. 1 times per day [email or web address]    When ready for intercourse you can have your partner use the dilator on you to warm up the tissue, make sure you have long period of foreplay,  Use lubricant on you and partner, and reapply lubricant if need to.   During intercourse do you deep breathing to relax you and pelvic floor  Make sure you are moisturizing the vaginal tissue  [facility]  The physical therapy experts in pelvic health ... [email or web address]   Urinary Incontinence & Pelvic Pain  [place], [place] ([place] ... [email or web address]   [facility] [place], [place], [place] [email or web address]  [facility] [address], [place], [place] [address] Phone # [phone] Fax  [phone]

## 2018-07-16 ENCOUNTER — Other Ambulatory Visit: Payer: Self-pay | Admitting: Obstetrics and Gynecology

## 2018-07-16 DIAGNOSIS — Z1231 Encounter for screening mammogram for malignant neoplasm of breast: Secondary | ICD-10-CM

## 2018-07-19 ENCOUNTER — Ambulatory Visit
Admission: RE | Admit: 2018-07-19 | Discharge: 2018-07-19 | Disposition: A | Discharge status: Home / Self Care | Payer: BLUE CROSS/BLUE SHIELD | Source: Ambulatory Visit | Attending: Obstetrics and Gynecology | Admitting: Obstetrics and Gynecology

## 2018-07-19 DIAGNOSIS — Z1231 Encounter for screening mammogram for malignant neoplasm of breast: Secondary | ICD-10-CM

## 2018-07-22 ENCOUNTER — Ambulatory Visit
Admission: RE | Admit: 2018-07-22 | Discharge: 2018-07-22 | Disposition: A | Discharge status: Home / Self Care | Payer: BLUE CROSS/BLUE SHIELD | Source: Ambulatory Visit | Attending: Obstetrics and Gynecology | Admitting: Obstetrics and Gynecology

## 2018-07-22 ENCOUNTER — Other Ambulatory Visit: Payer: Self-pay | Admitting: Obstetrics and Gynecology

## 2018-07-22 DIAGNOSIS — N6310 Unspecified lump in the right breast, unspecified quadrant: Secondary | ICD-10-CM

## 2019-05-21 ENCOUNTER — Telehealth: Payer: Self-pay | Admitting: Gastroenterology

## 2019-05-21 NOTE — Telephone Encounter (Signed)
Requests her records be reviewed. She feels she should be due for a screening colonoscopy. States personal history of polyps and family history of colon cancer. Paternal grandmother had colon cancer. Not sure of her age when diagnosed. Her father had "some kind of colon problem and had several feet of his intestines removed." States 2 of her child had colonoscopies age 56 yrs and age 64 yrs. Both children had polyps that were "benign." Former Dr Olevia Perches patient.

## 2019-05-21 NOTE — Telephone Encounter (Signed)
Patient requested to speak with the nurse regarding the recall date of her colon

## 2019-05-22 NOTE — Telephone Encounter (Signed)
Last colonoscopy 9 years ago.  We can proceed with colonoscopy given her history of adenomatous polyps, please schedule next available appointment.

## 2019-05-22 NOTE — Telephone Encounter (Signed)
Would someone please contact this patient and schedule her for a direct colonoscopy? Dr Silverio Decamp has reviewed her records.

## 2019-05-27 NOTE — Telephone Encounter (Signed)
Called patient back and Colonoscopy has been scheduled for 06-25-2019 with Dr Silverio Decamp.

## 2019-06-11 ENCOUNTER — Other Ambulatory Visit: Payer: Self-pay

## 2019-06-11 ENCOUNTER — Ambulatory Visit (AMBULATORY_SURGERY_CENTER): Payer: Self-pay

## 2019-06-11 VITALS — Ht 59.5 in | Wt 150.0 lb

## 2019-06-11 DIAGNOSIS — Z86010 Personal history of colonic polyps: Secondary | ICD-10-CM

## 2019-06-11 MED ORDER — NA SULFATE-K SULFATE-MG SULF 17.5-3.13-1.6 GM/177ML PO SOLN: 1.0000 | Freq: Once | ORAL | 0 refills | Status: AC

## 2019-06-11 NOTE — Progress Notes (Signed)
Denies allergies to eggs or soy products. Denies complication of anesthesia or sedation. Denies use of weight loss medication. Denies use of O2.   Emmi instructions given for colonoscopy.  Pre-Visit was conducted by phone due to Covid 19. Instructions were reviewed and mailed to patients home address. A 15.00 coupon for Suprep was given to the patient. Patient was encouraged to call if she had any questions or concerns regarding instructions.

## 2019-06-24 ENCOUNTER — Telehealth: Payer: Self-pay | Admitting: Gastroenterology

## 2019-06-24 ENCOUNTER — Encounter: Payer: Self-pay | Admitting: Gastroenterology

## 2019-06-24 NOTE — Telephone Encounter (Signed)
Pt responded "no" to all screening questions °

## 2019-06-24 NOTE — Telephone Encounter (Signed)
Patient has a question regarding her prep. She has a procedure for tomorrow.

## 2019-06-24 NOTE — Telephone Encounter (Signed)
Error

## 2019-06-24 NOTE — Telephone Encounter (Signed)
Spoke with patient regarding the Covid-19 screening questions. Covid-19 Screening Questions:  Do you now or have you had a fever in the last 14 days?   Do you have any respiratory symptoms of shortness of breath or cough now or in the last 14 days?   Do you have any family members or close contacts with diagnosed or  suspected Covid-19 in the past 14 days?   Have you been tested for Covid-19 and found to be positive?

## 2019-06-24 NOTE — Telephone Encounter (Signed)
I returned patients call to answer all of her questions regarding her prep. One of the patients biggest concerns was she has a two hour drive to get to her procedure. Patient was requesting to start her prep a little earlier than her scheduled time. I told the patient that she could start an hour early but not to do both bottles of prep this evening. I explained why it had to be split. Patient was also concerned with drinking three glasses of water after her prep. I explained why Dr. Silverio Decamp prefers the three glasses of water, and told her to try to get the three glasses. All questions were answered.   Riki Sheer, LPN ( Admitting )

## 2019-06-25 ENCOUNTER — Other Ambulatory Visit: Payer: Self-pay

## 2019-06-25 ENCOUNTER — Ambulatory Visit (AMBULATORY_SURGERY_CENTER): Payer: BC Managed Care – PPO | Admitting: Gastroenterology

## 2019-06-25 ENCOUNTER — Encounter: Payer: Self-pay | Admitting: Gastroenterology

## 2019-06-25 VITALS — BP 124/76 | HR 67 | Temp 97.4°F | Resp 15 | Ht 59.0 in | Wt 150.0 lb

## 2019-06-25 DIAGNOSIS — D124 Benign neoplasm of descending colon: Secondary | ICD-10-CM | POA: Diagnosis not present

## 2019-06-25 DIAGNOSIS — Z86010 Personal history of colonic polyps: Secondary | ICD-10-CM

## 2019-06-25 MED ORDER — SODIUM CHLORIDE 0.9 % IV SOLN: 500.0000 mL | Freq: Once | INTRAVENOUS | Status: DC

## 2019-06-25 NOTE — Progress Notes (Signed)
Called to room to assist during endoscopic procedure.  Patient ID and intended procedure confirmed with present staff. Received instructions for my participation in the procedure from the performing physician.  

## 2019-06-25 NOTE — Progress Notes (Signed)
Pt's states no medical or surgical changes since previsit or office visit.  Temp tracy Vitals JB

## 2019-06-25 NOTE — Progress Notes (Signed)
A/ox3, pleased with MAC, report to RN 

## 2019-06-25 NOTE — Op Note (Signed)
[facility] Patient Name: Victoria [name] Procedure Date: 06/25/2019 10:45 AM MRN: [id number] Endoscopist: Mauri Pole , MD Age: 58 Referring MD:  Date of Birth: September 23, 1961 Gender: Female Account #: [id number] Procedure:                Colonoscopy Indications:              High risk colon cancer surveillance: Personal                            history of colonic polyps, High risk colon cancer                            surveillance: Personal history of adenoma less than                            10 mm in size at age 25 in 12. Colon cancer in                            grandparent. Medicines:                Monitored Anesthesia Care Procedure:                Pre-Anesthesia Assessment:                           - Prior to the procedure, a History and Physical                            was performed, and patient medications and                            allergies were reviewed. The patient's tolerance of                            previous anesthesia was also reviewed. The risks                            and benefits of the procedure and the sedation                            options and risks were discussed with the patient.                            All questions were answered, and informed consent                            was obtained. Prior Anticoagulants: The patient has                            taken no previous anticoagulant or antiplatelet                            agents. ASA Grade Assessment: II - A patient with  mild systemic disease. After reviewing the risks                            and benefits, the patient was deemed in                            satisfactory condition to undergo the procedure.                           After obtaining informed consent, the colonoscope                            was passed under direct vision. Throughout the                            procedure, the patient's blood pressure, pulse, and                             oxygen saturations were monitored continuously. The                            Colonoscope was introduced through the anus and                            advanced to the the cecum, identified by                            appendiceal orifice and ileocecal valve. The                            colonoscopy was performed without difficulty. The                            patient tolerated the procedure well. The quality                            of the bowel preparation was excellent. The                            ileocecal valve, appendiceal orifice, and rectum                            were photographed. Scope In: 10:49:47 AM Scope Out: 11:01:30 AM Scope Withdrawal Time: 0 hours 7 minutes 39 seconds  Total Procedure Duration: 0 hours 11 minutes 43 seconds  Findings:                 The perianal and digital rectal examinations were                            normal.                           A 6 mm polyp was found in the descending colon. The  polyp was sessile. The polyp was removed with a                            cold snare. Resection and retrieval were complete.                           Non-bleeding internal hemorrhoids were found during                            retroflexion. The hemorrhoids were small.                           The exam was otherwise without abnormality. Complications:            No immediate complications. Estimated Blood Loss:     Estimated blood loss was minimal. Impression:               - One 6 mm polyp in the descending colon, removed                            with a cold snare. Resected and retrieved.                           - Non-bleeding internal hemorrhoids.                           - The examination was otherwise normal. Recommendation:           - Patient has a contact number available for                            emergencies. The signs and symptoms of potential                             delayed complications were discussed with the                            patient. Return to normal activities tomorrow.                            Written discharge instructions were provided to the                            patient.                           - Resume previous diet.                           - Continue present medications.                           - Await pathology results.                           - Repeat colonoscopy in 5 years for surveillance  based on pathology results. Mauri Pole, MD 06/25/2019 11:06:24 AM This report has been signed electronically.

## 2019-06-25 NOTE — Patient Instructions (Signed)
Please read handouts provided. Continue present medications. Await pathology results.        YOU HAD AN ENDOSCOPIC PROCEDURE TODAY AT THE [facility]:   Refer to the procedure report that was given to you for any specific questions about what was found during the examination.  If the procedure report does not answer your questions, please call your gastroenterologist to clarify.  If you requested that your care partner not be given the details of your procedure findings, then the procedure report has been included in a sealed envelope for you to review at your convenience later.  YOU SHOULD EXPECT: Some feelings of bloating in the abdomen. Passage of more gas than usual.  Walking can help get rid of the air that was put into your GI tract during the procedure and reduce the bloating. If you had a lower endoscopy (such as a colonoscopy or flexible sigmoidoscopy) you may notice spotting of blood in your stool or on the toilet paper. If you underwent a bowel prep for your procedure, you may not have a normal bowel movement for a few days.  Please Note:  You might notice some irritation and congestion in your nose or some drainage.  This is from the oxygen used during your procedure.  There is no need for concern and it should clear up in a day or so.  SYMPTOMS TO REPORT IMMEDIATELY:   Following lower endoscopy (colonoscopy or flexible sigmoidoscopy):  Excessive amounts of blood in the stool  Significant tenderness or worsening of abdominal pains  Swelling of the abdomen that is new, acute  Fever of 100F or higher    For urgent or emergent issues, a gastroenterologist can be reached at any hour by calling [phone].   DIET:  We do recommend a small meal at first, but then you may proceed to your regular diet.  Drink plenty of fluids but you should avoid alcoholic beverages for 24 hours.  ACTIVITY:  You should plan to take it easy for the rest of today and you should NOT  DRIVE or use heavy machinery until tomorrow (because of the sedation medicines used during the test).    FOLLOW UP: Our staff will call the number listed on your records 48-72 hours following your procedure to check on you and address any questions or concerns that you may have regarding the information given to you following your procedure. If we do not reach you, we will leave a message.  We will attempt to reach you two times.  During this call, we will ask if you have developed any symptoms of COVID 19. If you develop any symptoms (ie: fever, flu-like symptoms, shortness of breath, cough etc.) before then, please call [phone].  If you test positive for Covid 19 in the 2 weeks post procedure, please call and report this information to us.    If any biopsies were taken you will be contacted by phone or by letter within the next 1-3 weeks.  Please call us at [phone] if you have not heard about the biopsies in 3 weeks.    SIGNATURES/CONFIDENTIALITY: You and/or your care partner have signed paperwork which will be entered into your electronic medical record.  These signatures attest to the fact that that the information above on your After Visit Summary has been reviewed and is understood.  Full responsibility of the confidentiality of this discharge information lies with you and/or your care-partner. 

## 2019-06-27 ENCOUNTER — Telehealth: Payer: Self-pay

## 2019-06-27 NOTE — Telephone Encounter (Signed)
First attempt follow up call, left message to call back if problems but otherwise we will call back this afternoon.

## 2019-06-27 NOTE — Telephone Encounter (Signed)
  Follow up Call-  Call back number 06/25/2019  Post procedure Call Back phone  # [phone]  Permission to leave phone message Yes  Some recent data might be hidden     Patient questions:  Do you have a fever, pain , or abdominal swelling? No. Pain Score  0 *  Have you tolerated food without any problems? Yes.    Have you been able to return to your normal activities? Yes.    Do you have any questions about your discharge instructions: Diet   No. Medications  No. Follow up visit  No.  Do you have questions or concerns about your Care? No.     Actions: * If pain score is 4 or above: No action needed, pain <4. 1. Have you developed a fever since your procedure? no  2.   Have you had an respiratory symptoms (SOB or cough) since your procedure? no  3.   Have you tested positive for COVID 19 since your procedure no  4.   Have you had any family members/close contacts diagnosed with the COVID 19 since your procedure?  no   If yes to any of these questions please route to Joylene John, RN and Alphonsa Gin, Therapist, sports.

## 2019-06-27 NOTE — Telephone Encounter (Signed)
When I called patient this afternoon for her post-procedure f/u call, she mentioned she left her iphone charger and ear buds here when she came for her procedure. Upon admission, it was documented she had her cell phone, but phone charger and ear buds were not documented.I told patient I would check and call her back. Phone charger and ear buds were not found when I checked with admitting, recovery and with front desk staff. I called patient back to let her know they could not be found and offered to have our supervisor call her back on Monday. Patient states she would like me to do so. Supervisor notified.

## 2019-06-28 IMAGING — MG DIGITAL DIAGNOSTIC UNILATERAL RIGHT MAMMOGRAM WITH TOMO AND CAD
4 series · 4 of 12 positions shown · non-contrast
Comparison: Previous exam(s).

CLINICAL DATA: Possible masses in the retronipple and lateral
retroareolar right breast on a recent screening mammogram.

EXAM:
DIGITAL DIAGNOSTIC RIGHT MAMMOGRAM WITH TOMO
ULTRASOUND RIGHT BREAST

[R MLO synth-2D]
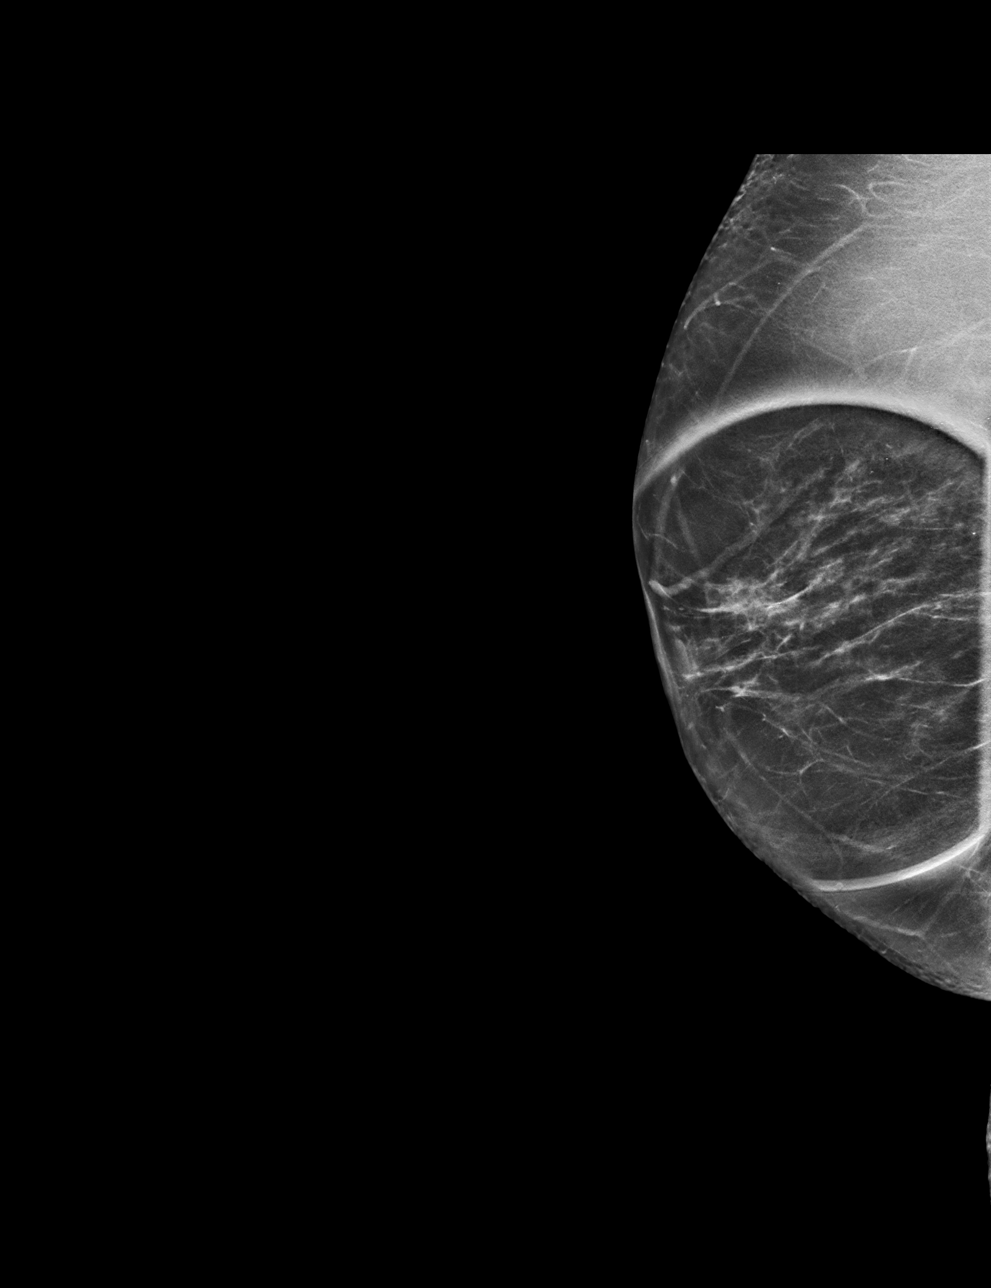

[R CC synth-2D]
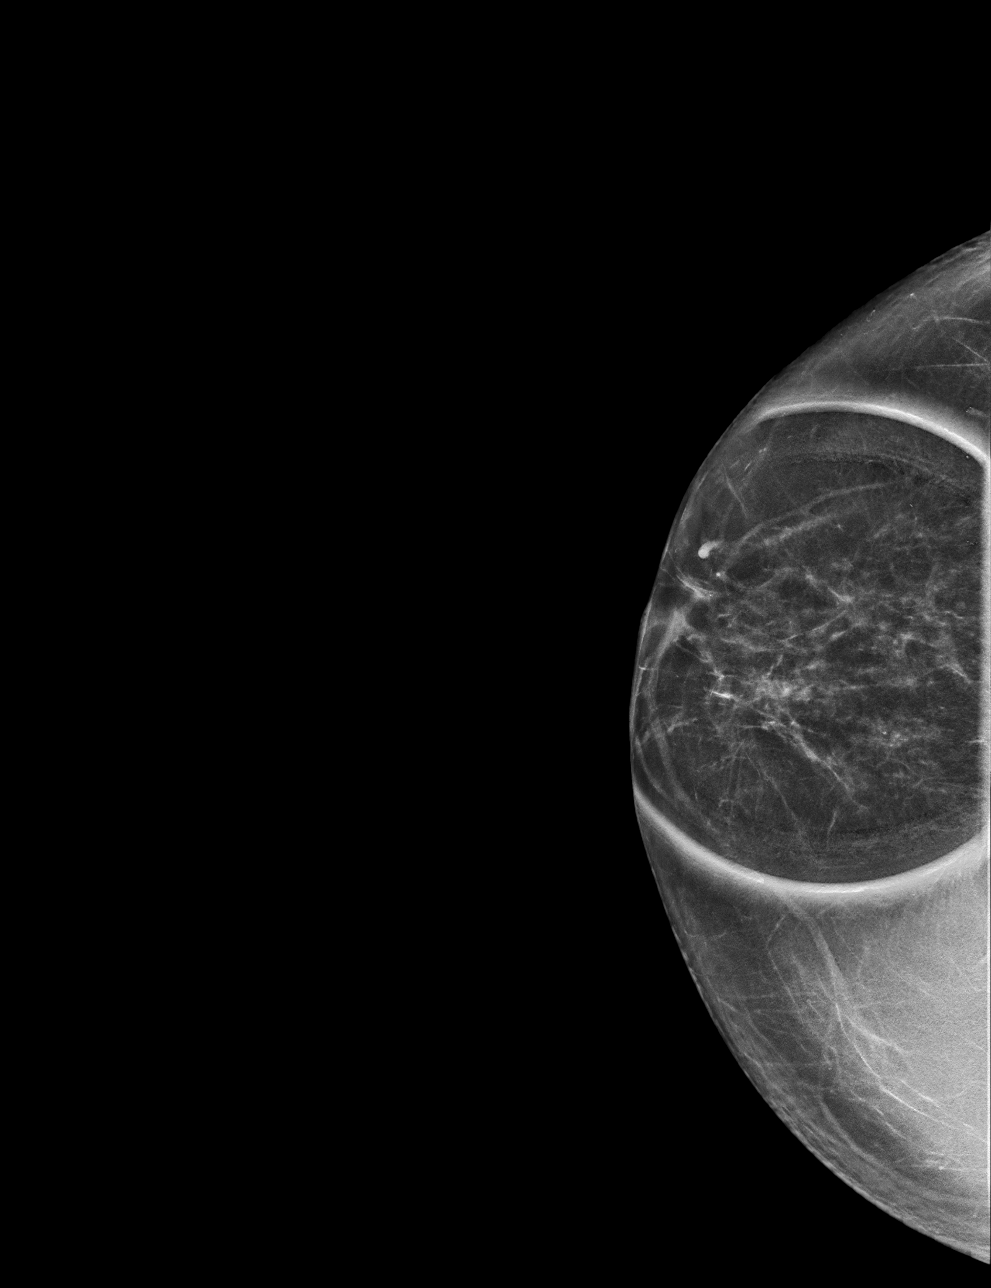

[R CC tomo · tomo slice 29/58.0]
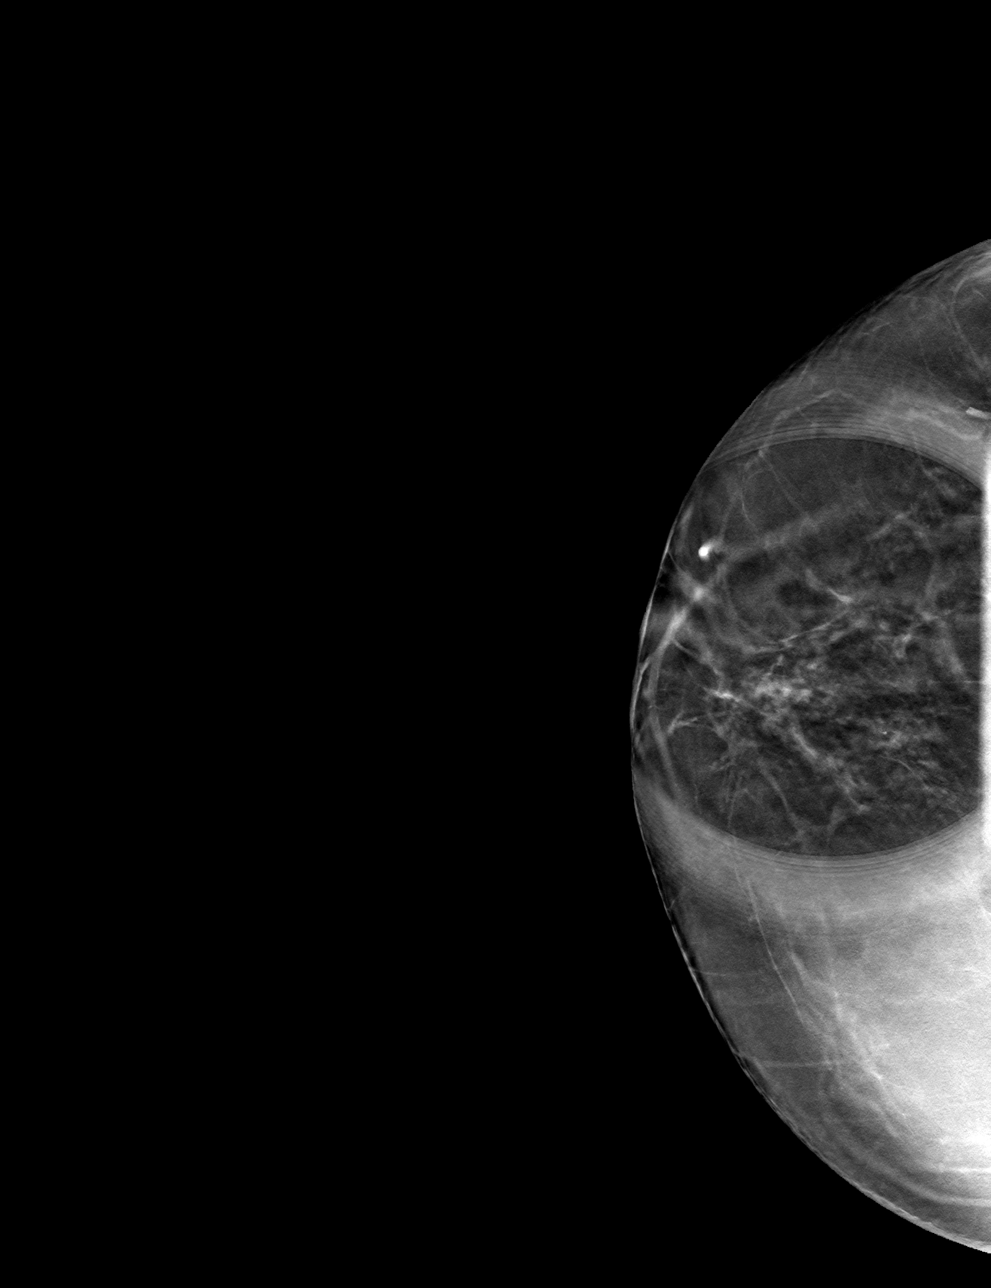

[R MLO tomo · tomo slice 31/62.0]
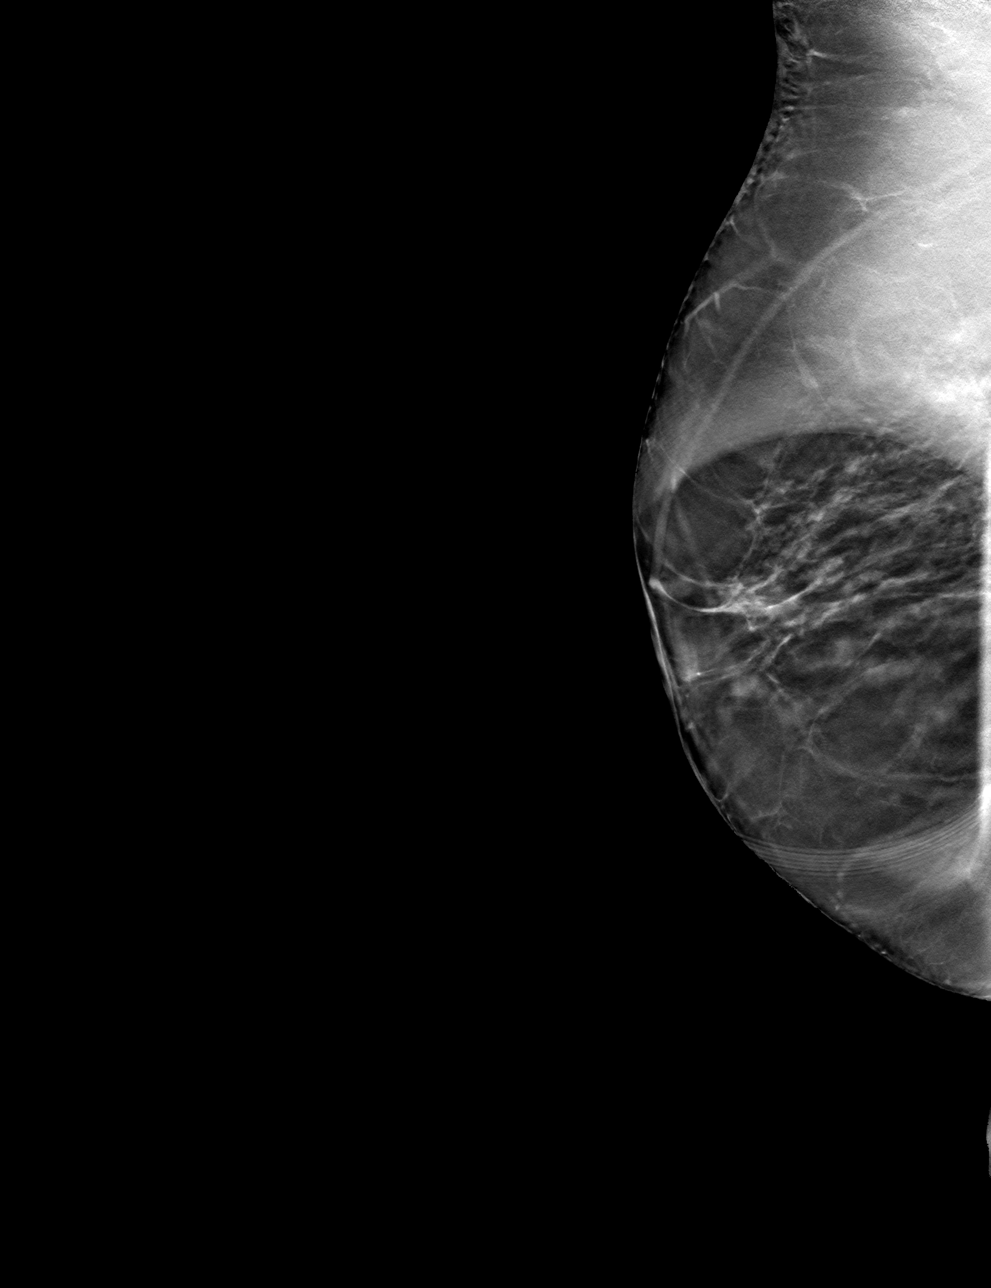

[4 of 12 positions shown; findings below may reference images not displayed]

ACR Breast Density Category b: There are scattered areas of
fibroglandular density.
FINDINGS: 3D tomographic and 2D generated spot compression views of the right
breast demonstrate normal appearing breast tissue at the locations
of the recently suspected masses, unchanged compared previous
examinations. No mass or other findings suspicious for malignancy
are seen.

On physical exam, no mass is palpable in the retronipple or lateral
retroareolar right breast.

Targeted ultrasound is performed, showing normal appearing breast
tissue throughout the retronipple and lateral retroareolar region of
the right breast.
IMPRESSION: No evidence of malignancy. The recently suspected right breast
masses or areas of close apposition of normal breast tissue.

RECOMMENDATION:
Bilateral screening mammogram in 1 year.

I have discussed the findings and recommendations with the patient.
Results were also provided in writing at the conclusion of the
visit. If applicable, a reminder letter will be sent to the patient
regarding the next appointment.

BI-RADS CATEGORY  1: Negative.

## 2019-07-01 ENCOUNTER — Telehealth: Payer: Self-pay | Admitting: *Deleted

## 2019-07-01 ENCOUNTER — Encounter: Payer: Self-pay | Admitting: Gastroenterology

## 2019-07-01 NOTE — Telephone Encounter (Signed)
Phoned patient.  She reported that she has not found her charger or ear buds.  I advised her that we would let her know if they are found.

## 2019-08-12 ENCOUNTER — Inpatient Hospital Stay: Payer: BC Managed Care – PPO | Admitting: Genetic Counselor

## 2019-08-12 ENCOUNTER — Inpatient Hospital Stay: Payer: BC Managed Care – PPO

## 2019-08-12 ENCOUNTER — Telehealth: Payer: Self-pay | Admitting: Genetic Counselor

## 2019-08-12 NOTE — Telephone Encounter (Signed)
Called regarding today's appointment. [name] would like to reschedule for a future date, but would like to discuss genetic testing with her children first. We briefly reviewed why she was referred for genetic counseling and the utility of genetic testing for Lynch syndrome. Provided my contact information and encouraged her to call back with any additional questions.

## 2019-12-19 ENCOUNTER — Ambulatory Visit (INDEPENDENT_AMBULATORY_CARE_PROVIDER_SITE_OTHER): Payer: BC Managed Care – PPO | Admitting: Gastroenterology

## 2019-12-19 ENCOUNTER — Encounter: Payer: Self-pay | Admitting: Gastroenterology

## 2019-12-19 DIAGNOSIS — E538 Deficiency of other specified B group vitamins: Secondary | ICD-10-CM

## 2019-12-19 DIAGNOSIS — R1310 Dysphagia, unspecified: Secondary | ICD-10-CM

## 2019-12-19 DIAGNOSIS — K219 Gastro-esophageal reflux disease without esophagitis: Secondary | ICD-10-CM | POA: Diagnosis not present

## 2019-12-19 DIAGNOSIS — R1110 Vomiting, unspecified: Secondary | ICD-10-CM

## 2019-12-19 DIAGNOSIS — Z86010 Personal history of colonic polyps: Secondary | ICD-10-CM

## 2019-12-19 MED ORDER — PANTOPRAZOLE SODIUM 40 MG PO TBEC: 40.0000 mg | DELAYED_RELEASE_TABLET | Freq: Every day | ORAL | 3 refills | Status: DC
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
## 2019-12-19 NOTE — Patient Instructions (Addendum)
Please send Rx to [facility] [place]  [facility] [place], [place] [phone], [phone]        Fax number to fax labs is  [phone]     CBC CMP Iron Panel H.pylori stool Ag  ( We have faxed orders to [facility])   Protonix 40mg , 30 min before dinner daily  EGD We will contact you to schedule   Lactose free diet  Antireflux measures   Lactose-Free Diet, Adult If you have lactose intolerance, you are not able to digest lactose. Lactose is a natural sugar found mainly in dairy milk and dairy products. You may need to avoid all foods and beverages that contain lactose. A lactose-free diet can help you do this. Which foods have lactose? Lactose is found in dairy milk and dairy products, such as:  Yogurt.  Cheese.  Butter.  Margarine.  Sour cream.  Cream.  Whipped toppings and nondairy creamers.  Ice cream and other dairy-based desserts. Lactose is also found in foods or products made with dairy milk or milk ingredients. To find out whether a food contains dairy milk or a milk ingredient, look at the ingredients list. Avoid foods with the statement "May contain milk" and foods that contain:  Milk powder.  Whey.  Curd.  Caseinate.  Lactose.  Lactalbumin.  Lactoglobulin. What are alternatives to dairy milk and foods made with milk products?  Lactose-free milk.  Soy milk with added calcium and vitamin D.  Almond milk, coconut milk, rice milk, or other nondairy milk alternatives with added calcium and vitamin D. Note that these are low in protein.  Soy products, such as soy yogurt, soy cheese, soy ice cream, and soy-based sour cream.  Other nut milk products, such as almond yogurt, almond cheese, cashew yogurt, cashew cheese, cashew ice cream, coconut yogurt, and coconut ice cream. What are tips for following this plan?  Do not consume foods, beverages, vitamins, minerals, or medicines containing lactose. Read ingredient lists carefully.  Look  for the words "lactose-free" on labels.  Use lactase enzyme drops or tablets as directed by your health care provider.  Use lactose-free milk or a milk alternative, such as soy milk or almond milk, for drinking and cooking.  Make sure you get enough calcium and vitamin D in your diet. A lactose-free eating plan can be lacking in these important nutrients.  Take calcium and vitamin D supplements as directed by your health care provider. Talk to your health care provider about supplements if you are not able to get enough calcium and vitamin D from food. What foods can I eat?  Fruits All fresh, canned, frozen, or dried fruits that are not processed with lactose. Vegetables All fresh, frozen, and canned vegetables without cheese, cream, or butter sauces. Grains Any that are not made with dairy milk or dairy products. Meats and other proteins Any meat, fish, poultry, and other protein sources that are not made with dairy milk or dairy products. Soy cheese and yogurt. Fats and oils Any that are not made with dairy milk or dairy products. Beverages Lactose-free milk. Soy, rice, or almond milk with added calcium and vitamin D. Fruit and vegetable juices. Sweets and desserts Any that are not made with dairy milk or dairy products. Seasonings and condiments Any that are not made with dairy milk or dairy products. Calcium Calcium is found in many foods that contain lactose and is important for bone health. The amount of calcium you need depends on your age:  Adults  younger than 50 years: 1,000 mg of calcium a day.  Adults older than 50 years: 1,200 mg of calcium a day. If you are not getting enough calcium, you may get it from other sources, including:  Orange juice with calcium added. There are 300-350 mg of calcium in 1 cup of orange juice.  Calcium-fortified soy milk. There are 300-400 mg of calcium in 1 cup of calcium-fortified soy milk.  Calcium-fortified rice or almond milk. There  are 300 mg of calcium in 1 cup of calcium-fortified rice or almond milk.  Calcium-fortified breakfast cereals. There are 100-1,000 mg of calcium in calcium-fortified breakfast cereals.  Spinach, cooked. There are 145 mg of calcium in  cup of cooked spinach.  Edamame, cooked. There are 130 mg of calcium in  cup of cooked edamame.  Collard greens, cooked. There are 125 mg of calcium in  cup of cooked collard greens.  Kale, frozen or cooked. There are 90 mg of calcium in  cup of cooked or frozen kale.  Almonds. There are 95 mg of calcium in  cup of almonds.  Broccoli, cooked. There are 60 mg of calcium in 1 cup of cooked broccoli. The items listed above may not be a complete list of recommended foods and beverages. Contact a dietitian for more options. What foods are not recommended? Fruits None, unless they are made with dairy milk or dairy products. Vegetables None, unless they are made with dairy milk or dairy products. Grains Any grains that are made with dairy milk or dairy products. Meats and other proteins None, unless they are made with dairy milk or dairy products. Dairy All dairy products, including milk, goat's milk, buttermilk, kefir, acidophilus milk, flavored milk, evaporated milk, condensed milk, dulce de [place], eggnog, yogurt, cheese, and cheese spreads. Fats and oils Any that are made with milk or milk products. Margarines and salad dressings that contain milk or cheese. Cream. Half and half. Cream cheese. Sour cream. Chip dips made with sour cream or yogurt. Beverages Hot chocolate. Cocoa with lactose. Instant iced teas. Powdered fruit drinks. Smoothies made with dairy milk or yogurt. Sweets and desserts Any that are made with milk or milk products. Seasonings and condiments Chewing gum that has lactose. Spice blends if they contain lactose. Artificial sweeteners that contain lactose. Nondairy creamers. The items listed above may not be a complete list of foods  and beverages to avoid. Contact a dietitian for more information. Summary  If you are lactose intolerant, it means that you have a hard time digesting lactose, a natural sugar found in milk and milk products.  Following a lactose-free diet can help you manage this condition.  Calcium is important for bone health and is found in many foods that contain lactose. Talk with your health care provider about other sources of calcium. This information is not intended to replace advice given to you by your health care provider. Make sure you discuss any questions you have with your health care provider. Document Revised: 12/25/2017 Document Reviewed: 12/25/2017 Elsevier Patient Education  [place].    Gastroesophageal Reflux Disease, Adult Gastroesophageal reflux (GER) happens when acid from the stomach flows up into the tube that connects the mouth and the stomach (esophagus). Normally, food travels down the esophagus and stays in the stomach to be digested. However, when a person has GER, food and stomach acid sometimes move back up into the esophagus. If this becomes a more serious problem, the person may be diagnosed with a disease called  gastroesophageal reflux disease (GERD). GERD occurs when the reflux:  Happens often.  Causes frequent or severe symptoms.  Causes problems such as damage to the esophagus. When stomach acid comes in contact with the esophagus, the acid may cause soreness (inflammation) in the esophagus. Over time, GERD may create small holes (ulcers) in the lining of the esophagus. What are the causes? This condition is caused by a problem with the muscle between the esophagus and the stomach (lower esophageal sphincter, or LES). Normally, the LES muscle closes after food passes through the esophagus to the stomach. When the LES is weakened or abnormal, it does not close properly, and that allows food and stomach acid to go back up into the esophagus. The LES can be  weakened by certain dietary substances, medicines, and medical conditions, including:  Tobacco use.  Pregnancy.  Having a hiatal hernia.  Alcohol use.  Certain foods and beverages, such as coffee, chocolate, onions, and peppermint. What increases the risk? You are more likely to develop this condition if you:  Have an increased body weight.  Have a connective tissue disorder.  Use NSAID medicines. What are the signs or symptoms? Symptoms of this condition include:  Heartburn.  Difficult or painful swallowing.  The feeling of having a lump in the throat.  Abitter taste in the mouth.  Bad breath.  Having a large amount of saliva.  Having an upset or bloated stomach.  Belching.  Chest pain. Different conditions can cause chest pain. Make sure you see your health care provider if you experience chest pain.  Shortness of breath or wheezing.  Ongoing (chronic) cough or a night-time cough.  Wearing away of tooth enamel.  Weight loss. How is this diagnosed? Your health care provider will take a medical history and perform a physical exam. To determine if you have mild or severe GERD, your health care provider may also monitor how you respond to treatment. You may also have tests, including:  A test to examine your stomach and esophagus with a small camera (endoscopy).  A test thatmeasures the acidity level in your esophagus.  A test thatmeasures how much pressure is on your esophagus.  A barium swallow or modified barium swallow test to show the shape, size, and functioning of your esophagus. How is this treated? The goal of treatment is to help relieve your symptoms and to prevent complications. Treatment for this condition may vary depending on how severe your symptoms are. Your health care provider may recommend:  Changes to your diet.  Medicine.  Surgery. Follow these instructions at home: Eating and drinking   Follow a diet as recommended by your  health care provider. This may involve avoiding foods and drinks such as: ? Coffee and tea (with or without caffeine). ? Drinks that containalcohol. ? Energy drinks and sports drinks. ? Carbonated drinks or sodas. ? Chocolate and cocoa. ? Peppermint and mint flavorings. ? Garlic and onions. ? Horseradish. ? Spicy and acidic foods, including peppers, chili powder, curry powder, vinegar, hot sauces, and barbecue sauce. ? Citrus fruit juices and citrus fruits, such as oranges, lemons, and limes. ? Tomato-based foods, such as red sauce, chili, salsa, and pizza with red sauce. ? Fried and fatty foods, such as donuts, french fries, potato chips, and high-fat dressings. ? High-fat meats, such as hot dogs and fatty cuts of red and white meats, such as rib eye steak, sausage, ham, and bacon. ? High-fat dairy items, such as whole milk, butter, and cream cheese.  Eat small, frequent meals instead of large meals.  Avoid drinking large amounts of liquid with your meals.  Avoid eating meals during the 2-3 hours before bedtime.  Avoid lying down right after you eat.  Do not exercise right after you eat. Lifestyle   Do not use any products that contain nicotine or tobacco, such as cigarettes, e-cigarettes, and chewing tobacco. If you need help quitting, ask your health care provider.  Try to reduce your stress by using methods such as yoga or meditation. If you need help reducing stress, ask your health care provider.  If you are overweight, reduce your weight to an amount that is healthy for you. Ask your health care provider for guidance about a safe weight loss goal. General instructions  Pay attention to any changes in your symptoms.  Take over-the-counter and prescription medicines only as told by your health care provider. Do not take aspirin, ibuprofen, or other NSAIDs unless your health care provider told you to do so.  Wear loose-fitting clothing. Do not wear anything tight around  your waist that causes pressure on your abdomen.  Raise (elevate) the head of your bed about 6 inches (15 cm).  Avoid bending over if this makes your symptoms worse.  Keep all follow-up visits as told by your health care provider. This is important. Contact a health care provider if:  You have: ? New symptoms. ? Unexplained weight loss. ? Difficulty swallowing or it hurts to swallow. ? Wheezing or a persistent cough. ? A hoarse voice.  Your symptoms do not improve with treatment. Get help right away if you:  Have pain in your arms, neck, jaw, teeth, or back.  Feel sweaty, dizzy, or light-headed.  Have chest pain or shortness of breath.  Vomit and your vomit looks like blood or coffee grounds.  Faint.  Have stool that is bloody or black.  Cannot swallow, drink, or eat. Summary  Gastroesophageal reflux happens when acid from the stomach flows up into the esophagus. GERD is a disease in which the reflux happens often, causes frequent or severe symptoms, or causes problems such as damage to the esophagus.  Treatment for this condition may vary depending on how severe your symptoms are. Your health care provider may recommend diet and lifestyle changes, medicine, or surgery.  Contact a health care provider if you have new or worsening symptoms.  Take over-the-counter and prescription medicines only as told by your health care provider. Do not take aspirin, ibuprofen, or other NSAIDs unless your health care provider told you to do so.  Keep all follow-up visits as told by your health care provider. This is important. This information is not intended to replace advice given to you by your health care provider. Make sure you discuss any questions you have with your health care provider. Document Revised: 06/05/2018 Document Reviewed: 06/05/2018 Elsevier Patient Education  [place].  I appreciate the  opportunity to care for you  Thank You   Harl Bowie ,  MD

## 2019-12-22 ENCOUNTER — Telehealth: Payer: Self-pay | Admitting: *Deleted

## 2020-01-01 MED ORDER — PANTOPRAZOLE SODIUM 40 MG PO TBEC: 40.0000 mg | DELAYED_RELEASE_TABLET | Freq: Every day | ORAL | 3 refills | Status: DC

## 2020-01-01 NOTE — Telephone Encounter (Signed)
Called patient again to schedule EGD, at this time she does not want to schedule her EGD she is in [place] and "trying to figure my life out"   Resent her protonix to another pharmacy in [place] as requested  Pt will call back to schedule EGD later this year

## 2020-05-07 ENCOUNTER — Encounter: Payer: Self-pay | Admitting: Gastroenterology

## 2020-05-07 ENCOUNTER — Other Ambulatory Visit: Payer: Self-pay | Admitting: Obstetrics and Gynecology

## 2020-05-07 DIAGNOSIS — Z1231 Encounter for screening mammogram for malignant neoplasm of breast: Secondary | ICD-10-CM

## 2020-05-24 ENCOUNTER — Other Ambulatory Visit: Payer: Self-pay

## 2020-05-24 ENCOUNTER — Ambulatory Visit (AMBULATORY_SURGERY_CENTER): Payer: BC Managed Care – PPO | Admitting: *Deleted

## 2020-05-24 VITALS — Ht 59.0 in | Wt 153.0 lb

## 2020-05-24 DIAGNOSIS — K219 Gastro-esophageal reflux disease without esophagitis: Secondary | ICD-10-CM

## 2020-05-24 DIAGNOSIS — Z01818 Encounter for other preprocedural examination: Secondary | ICD-10-CM

## 2020-05-24 NOTE — Progress Notes (Signed)
Pt verified name, DOB, address and insurance during PV today. Pt mailed instruction packet to daughters address to included paper to complete and mail back to [facility] with addressed and stamped envelope, Emmi video, copy of consent form to read and not return, and instructions.PV completed over the phone. Pt encouraged to call with questions or issues    No egg or soy allergy known to patient  No issues with past sedation with any surgeries  or procedures, no intubation problems  No diet pills per patient No home 02 use per patient  No blood thinners per patient  Pt denies issues with constipation  No A fib or A flutter  EMMI video sent to pt's e mail   [place] covid test   Due to the COVID-19 pandemic we are asking patients to follow these guidelines. Please only bring one care partner. Please be aware that your care partner may wait in the car in the parking lot or if they feel like they will be too hot to wait in the car, they may wait in the lobby on the 4th floor. All care partners are required to wear a mask the entire time (we do not have any that we can provide them), they need to practice social distancing, and we will do a Covid check for all patient's and care partners when you arrive. Also we will check their temperature and your temperature. If the care partner waits in their car they need to stay in the parking lot the entire time and we will call them on their cell phone when the patient is ready for discharge so they can bring the car to the front of the building. Also all patient's will need to wear a mask into building.

## 2020-05-28 ENCOUNTER — Encounter: Payer: Self-pay | Admitting: Gastroenterology

## 2020-05-31 ENCOUNTER — Ambulatory Visit: Payer: BC Managed Care – PPO

## 2020-06-02 ENCOUNTER — Other Ambulatory Visit: Admission: RE | Admit: 2020-06-02 | Payer: BC Managed Care – PPO | Source: Ambulatory Visit

## 2020-06-04 ENCOUNTER — Encounter: Payer: BC Managed Care – PPO | Admitting: Gastroenterology

## 2020-07-19 ENCOUNTER — Other Ambulatory Visit: Payer: Self-pay

## 2020-07-19 ENCOUNTER — Ambulatory Visit (INDEPENDENT_AMBULATORY_CARE_PROVIDER_SITE_OTHER): Payer: BC Managed Care – PPO

## 2020-07-19 ENCOUNTER — Ambulatory Visit
Admission: RE | Admit: 2020-07-19 | Discharge: 2020-07-19 | Disposition: A | Discharge status: Home / Self Care | Payer: BC Managed Care – PPO | Source: Ambulatory Visit | Attending: Obstetrics and Gynecology | Admitting: Obstetrics and Gynecology

## 2020-07-19 ENCOUNTER — Other Ambulatory Visit (HOSPITAL_COMMUNITY): Payer: BC Managed Care – PPO

## 2020-07-19 ENCOUNTER — Other Ambulatory Visit: Payer: Self-pay | Admitting: Gastroenterology

## 2020-07-19 DIAGNOSIS — Z1231 Encounter for screening mammogram for malignant neoplasm of breast: Secondary | ICD-10-CM

## 2020-07-19 DIAGNOSIS — Z1159 Encounter for screening for other viral diseases: Secondary | ICD-10-CM

## 2020-07-19 LAB — SARS CORONAVIRUS 2 (TAT 6-24 HRS): SARS Coronavirus 2: NEGATIVE

## 2020-07-20 ENCOUNTER — Other Ambulatory Visit: Payer: Self-pay | Admitting: Obstetrics and Gynecology

## 2020-07-20 DIAGNOSIS — R928 Other abnormal and inconclusive findings on diagnostic imaging of breast: Secondary | ICD-10-CM

## 2020-07-21 ENCOUNTER — Other Ambulatory Visit: Payer: Self-pay

## 2020-07-21 ENCOUNTER — Ambulatory Visit (AMBULATORY_SURGERY_CENTER): Payer: BC Managed Care – PPO | Admitting: Gastroenterology

## 2020-07-21 ENCOUNTER — Encounter: Payer: Self-pay | Admitting: Gastroenterology

## 2020-07-21 VITALS — BP 135/77 | HR 75 | Temp 98.0°F | Resp 22 | Ht 59.0 in | Wt 155.0 lb

## 2020-07-21 DIAGNOSIS — K2970 Gastritis, unspecified, without bleeding: Secondary | ICD-10-CM

## 2020-07-21 DIAGNOSIS — K317 Polyp of stomach and duodenum: Secondary | ICD-10-CM | POA: Diagnosis not present

## 2020-07-21 DIAGNOSIS — K219 Gastro-esophageal reflux disease without esophagitis: Secondary | ICD-10-CM

## 2020-07-21 MED ORDER — SODIUM CHLORIDE 0.9 % IV SOLN
500.0000 mL | Freq: Once | INTRAVENOUS | Status: DC
Start: 2020-07-21 — End: 2020-07-21

## 2020-07-21 MED ORDER — PANTOPRAZOLE SODIUM 40 MG PO TBEC: 40.0000 mg | DELAYED_RELEASE_TABLET | Freq: Every day | ORAL | 12 refills | Status: AC

## 2020-07-21 NOTE — Patient Instructions (Signed)
Pick up prescription   YOU HAD AN ENDOSCOPIC PROCEDURE TODAY AT [facility]:   Refer to the procedure report that was given to you for any specific questions about what was found during the examination.  If the procedure report does not answer your questions, please call your gastroenterologist to clarify.  If you requested that your care partner not be given the details of your procedure findings, then the procedure report has been included in a sealed envelope for you to review at your convenience later.  YOU SHOULD EXPECT: Some feelings of bloating in the abdomen. Passage of more gas than usual.  Walking can help get rid of the air that was put into your GI tract during the procedure and reduce the bloating. If you had a lower endoscopy (such as a colonoscopy or flexible sigmoidoscopy) you may notice spotting of blood in your stool or on the toilet paper. If you underwent a bowel prep for your procedure, you may not have a normal bowel movement for a few days.  Please Note:  You might notice some irritation and congestion in your nose or some drainage.  This is from the oxygen used during your procedure.  There is no need for concern and it should clear up in a day or so.  SYMPTOMS TO REPORT IMMEDIATELY:    Following upper endoscopy (EGD)  Vomiting of blood or coffee ground material  New chest pain or pain under the shoulder blades  Painful or persistently difficult swallowing  New shortness of breath  Fever of 100F or higher  Black, tarry-looking stools  For urgent or emergent issues, a gastroenterologist can be reached at any hour by calling [phone]. Do not use MyChart messaging for urgent concerns.    DIET:  We do recommend a small meal at first, but then you may proceed to your regular diet.  Drink plenty of fluids but you should avoid alcoholic beverages for 24 hours.  ACTIVITY:  You should plan to take it easy for the rest of today and you should NOT DRIVE  or use heavy machinery until tomorrow (because of the sedation medicines used during the test).    FOLLOW UP: Our staff will call the number listed on your records 48-72 hours following your procedure to check on you and address any questions or concerns that you may have regarding the information given to you following your procedure. If we do not reach you, we will leave a message.  We will attempt to reach you two times.  During this call, we will ask if you have developed any symptoms of COVID 19. If you develop any symptoms (ie: fever, flu-like symptoms, shortness of breath, cough etc.) before then, please call [phone].  If you test positive for Covid 19 in the 2 weeks post procedure, please call and report this information to [place].    If any biopsies were taken you will be contacted by phone or by letter within the next 1-3 weeks.  Please call us at [phone] if you have not heard about the biopsies in 3 weeks.    SIGNATURES/CONFIDENTIALITY: You and/or your care partner have signed paperwork which will be entered into your electronic medical record.  These signatures attest to the fact that that the information above on your After Visit Summary has been reviewed and is understood.  Full responsibility of the confidentiality of this discharge information lies with you and/or your care-partner.

## 2020-07-21 NOTE — Op Note (Signed)
[place] Patient Name: Victoria [name] Procedure Date: 07/21/2020 2:09 PM MRN: [id number] Endoscopist: Mauri Pole , MD Age: 59 Referring MD:  Date of Birth: 08-31-1961 Gender: Female Account #: [id number] Procedure:                Upper GI endoscopy Indications:              Dysphagia, Epigastric abdominal pain, Esophageal                            reflux symptoms that recur despite appropriate                            therapy Medicines:                Monitored Anesthesia Care Procedure:                Pre-Anesthesia Assessment:                           - Prior to the procedure, a History and Physical                            was performed, and patient medications and                            allergies were reviewed. The patient's tolerance of                            previous anesthesia was also reviewed. The risks                            and benefits of the procedure and the sedation                            options and risks were discussed with the patient.                            All questions were answered, and informed consent                            was obtained. Prior Anticoagulants: The patient has                            taken no previous anticoagulant or antiplatelet                            agents. ASA Grade Assessment: II - A patient with                            mild systemic disease. After reviewing the risks                            and benefits, the patient was deemed in  satisfactory condition to undergo the procedure.                           After obtaining informed consent, the endoscope was                            passed under direct vision. Throughout the                            procedure, the patient's blood pressure, pulse, and                            oxygen saturations were monitored continuously. The                            Endoscope was introduced through the mouth, and                             advanced to the second part of duodenum. The upper                            GI endoscopy was accomplished without difficulty.                            The patient tolerated the procedure well. Scope In: Scope Out: Findings:                 The Z-line was regular and was found 32 cm from the                            incisors.                           The gastroesophageal flap valve was visualized                            endoscopically and classified as Hill Grade IV (no                            fold, wide open lumen, hiatal hernia present).                           A 5 cm hiatal hernia was present.                           Patchy mild inflammation characterized by                            congestion (edema), erosions and erythema was found                            in the gastric antrum and in the prepyloric region                            of the  stomach. Biopsies were taken with a cold                            forceps for Helicobacter pylori testing.                           The first portion of the duodenum and second                            portion of the duodenum were normal. Biopsies for                            histology were taken with a cold forceps for                            evaluation of celiac disease. Complications:            No immediate complications. Estimated Blood Loss:     Estimated blood loss was minimal. Impression:               - Z-line regular, 32 cm from the incisors.                           - Gastroesophageal flap valve classified as Hill                            Grade IV (no fold, wide open lumen, hiatal hernia                            present).                           - 5 cm hiatal hernia.                           - Gastritis. Biopsied.                           - Normal first portion of the duodenum and second                            portion of the duodenum. Biopsied. Recommendation:            - Patient has a contact number available for                            emergencies. The signs and symptoms of potential                            delayed complications were discussed with the                            patient. Return to normal activities tomorrow.                            Written discharge instructions were provided  to the                            patient.                           - Resume previous diet.                           - Continue present medications.                           - Await pathology results.                           - Use Protonix (pantoprazole) 40 mg PO daily                            indefinitely.                           - Follow an antireflux regimen indefinitely. Mauri Pole, MD 07/21/2020 2:34:19 PM This report has been signed electronically.

## 2020-07-21 NOTE — Progress Notes (Signed)
Pt's states no medical or surgical changes since previsit or office visit. 

## 2020-07-21 NOTE — Progress Notes (Signed)
To PACU, VSS. Report to [email or web address] 

## 2020-07-21 NOTE — Progress Notes (Signed)
Called to room to assist during endoscopic procedure.  Patient ID and intended procedure confirmed with present staff. Received instructions for my participation in the procedure from the performing physician.  

## 2020-07-23 ENCOUNTER — Telehealth: Payer: Self-pay

## 2020-07-23 NOTE — Telephone Encounter (Signed)
  Follow up Call-  Call back number 07/21/2020 06/25/2019  Post procedure Call Back phone  # [phone]  Permission to leave phone message Yes Yes  Some recent data might be hidden     Patient questions:  Do you have a fever, pain , or abdominal swelling? No. Pain Score  0 *  Have you tolerated food without any problems? Yes.    Have you been able to return to your normal activities? Yes.    Do you have any questions about your discharge instructions: Diet   No. Medications  No. Follow up visit  No.  Do you have questions or concerns about your Care? No.  Actions: * If pain score is 4 or above: No action needed, pain <4.  1. Have you developed a fever since your procedure? no  2.   Have you had an respiratory symptoms (SOB or cough) since your procedure? no  3.   Have you tested positive for COVID 19 since your procedure no  4.   Have you had any family members/close contacts diagnosed with the COVID 19 since your procedure?  no   If yes to any of these questions please route to Joylene John, RN and Joella Prince, RN

## 2020-07-26 ENCOUNTER — Ambulatory Visit
Admission: RE | Admit: 2020-07-26 | Discharge: 2020-07-26 | Disposition: A | Discharge status: Home / Self Care | Payer: BC Managed Care – PPO | Source: Ambulatory Visit | Attending: Obstetrics and Gynecology | Admitting: Obstetrics and Gynecology

## 2020-07-26 ENCOUNTER — Other Ambulatory Visit: Payer: Self-pay

## 2020-07-26 ENCOUNTER — Other Ambulatory Visit: Payer: Self-pay | Admitting: Obstetrics and Gynecology

## 2020-07-26 DIAGNOSIS — R928 Other abnormal and inconclusive findings on diagnostic imaging of breast: Secondary | ICD-10-CM

## 2020-07-30 ENCOUNTER — Encounter: Payer: Self-pay | Admitting: Gastroenterology

## 2020-08-03 ENCOUNTER — Other Ambulatory Visit: Payer: Self-pay | Admitting: Obstetrics and Gynecology

## 2020-08-03 DIAGNOSIS — N62 Hypertrophy of breast: Secondary | ICD-10-CM

## 2020-08-18 ENCOUNTER — Other Ambulatory Visit: Payer: BC Managed Care – PPO

## 2020-09-03 ENCOUNTER — Telehealth: Payer: Self-pay | Admitting: Gastroenterology

## 2020-09-11 ENCOUNTER — Other Ambulatory Visit: Payer: BC Managed Care – PPO

## 2020-10-28 ENCOUNTER — Other Ambulatory Visit: Payer: Self-pay | Admitting: General Surgery

## 2020-10-28 DIAGNOSIS — N6092 Unspecified benign mammary dysplasia of left breast: Secondary | ICD-10-CM

## 2020-11-17 ENCOUNTER — Other Ambulatory Visit: Payer: Self-pay | Admitting: General Surgery

## 2020-11-17 DIAGNOSIS — N6092 Unspecified benign mammary dysplasia of left breast: Secondary | ICD-10-CM

## 2020-11-18 ENCOUNTER — Other Ambulatory Visit: Payer: Self-pay

## 2020-11-18 ENCOUNTER — Encounter (HOSPITAL_BASED_OUTPATIENT_CLINIC_OR_DEPARTMENT_OTHER): Payer: Self-pay | Admitting: General Surgery

## 2020-11-22 ENCOUNTER — Encounter (HOSPITAL_BASED_OUTPATIENT_CLINIC_OR_DEPARTMENT_OTHER)
Admission: RE | Admit: 2020-11-22 | Discharge: 2020-11-22 | Disposition: A | Discharge status: Home / Self Care | Payer: BC Managed Care – PPO | Source: Ambulatory Visit | Attending: General Surgery | Admitting: General Surgery

## 2020-11-22 ENCOUNTER — Other Ambulatory Visit (HOSPITAL_COMMUNITY)
Admission: RE | Admit: 2020-11-22 | Discharge: 2020-11-22 | Disposition: A | Discharge status: Home / Self Care | Payer: BC Managed Care – PPO | Source: Ambulatory Visit | Attending: General Surgery | Admitting: General Surgery

## 2020-11-22 DIAGNOSIS — N62 Hypertrophy of breast: Secondary | ICD-10-CM | POA: Diagnosis not present

## 2020-11-22 DIAGNOSIS — Z01818 Encounter for other preprocedural examination: Secondary | ICD-10-CM | POA: Insufficient documentation

## 2020-11-22 DIAGNOSIS — Z20822 Contact with and (suspected) exposure to covid-19: Secondary | ICD-10-CM | POA: Insufficient documentation

## 2020-11-22 DIAGNOSIS — D0512 Intraductal carcinoma in situ of left breast: Secondary | ICD-10-CM | POA: Diagnosis not present

## 2020-11-22 DIAGNOSIS — I10 Essential (primary) hypertension: Secondary | ICD-10-CM | POA: Insufficient documentation

## 2020-11-22 DIAGNOSIS — N6092 Unspecified benign mammary dysplasia of left breast: Secondary | ICD-10-CM | POA: Diagnosis present

## 2020-11-22 DIAGNOSIS — Z01812 Encounter for preprocedural laboratory examination: Secondary | ICD-10-CM | POA: Insufficient documentation

## 2020-11-22 DIAGNOSIS — N6082 Other benign mammary dysplasias of left breast: Secondary | ICD-10-CM | POA: Diagnosis not present

## 2020-11-22 DIAGNOSIS — Z79899 Other long term (current) drug therapy: Secondary | ICD-10-CM | POA: Diagnosis not present

## 2020-11-22 DIAGNOSIS — Z79890 Hormone replacement therapy (postmenopausal): Secondary | ICD-10-CM | POA: Diagnosis not present

## 2020-11-22 LAB — BASIC METABOLIC PANEL
Anion gap: 8 (ref 5–15)
BUN: 17 mg/dL (ref 6–20)
CO2: 28 mmol/L (ref 22–32)
Calcium: 9.1 mg/dL (ref 8.9–10.3)
Chloride: 103 mmol/L (ref 98–111)
Creatinine, Ser: 0.74 mg/dL (ref 0.44–1.00)
GFR, Estimated: >60 mL/min (ref >60)
Glucose, Bld: 115 mg/dL — ABNORMAL HIGH (ref 70–99)
Potassium: 4.4 mmol/L (ref 3.5–5.1)
Sodium: 139 mmol/L (ref 135–145)

## 2020-11-22 MED ORDER — ENSURE PRE-SURGERY PO LIQD: 296.0000 mL | Freq: Once | ORAL | Status: DC

## 2020-11-22 NOTE — Progress Notes (Signed)

## 2020-11-23 LAB — SARS CORONAVIRUS 2 (TAT 6-24 HRS): SARS Coronavirus 2: NEGATIVE

## 2020-11-24 ENCOUNTER — Ambulatory Visit
Admission: RE | Admit: 2020-11-24 | Discharge: 2020-11-24 | Disposition: A | Discharge status: Home / Self Care | Payer: BC Managed Care – PPO | Source: Ambulatory Visit | Attending: General Surgery | Admitting: General Surgery

## 2020-11-24 ENCOUNTER — Other Ambulatory Visit: Payer: Self-pay

## 2020-11-24 DIAGNOSIS — N6092 Unspecified benign mammary dysplasia of left breast: Secondary | ICD-10-CM

## 2020-11-25 ENCOUNTER — Encounter (HOSPITAL_BASED_OUTPATIENT_CLINIC_OR_DEPARTMENT_OTHER): Payer: Self-pay | Admitting: General Surgery

## 2020-11-25 ENCOUNTER — Ambulatory Visit (HOSPITAL_BASED_OUTPATIENT_CLINIC_OR_DEPARTMENT_OTHER): Payer: BC Managed Care – PPO | Admitting: Certified Registered"

## 2020-11-25 ENCOUNTER — Ambulatory Visit
Admission: RE | Admit: 2020-11-25 | Discharge: 2020-11-25 | Disposition: A | Discharge status: Home / Self Care | Payer: BC Managed Care – PPO | Source: Ambulatory Visit | Attending: General Surgery | Admitting: General Surgery

## 2020-11-25 ENCOUNTER — Ambulatory Visit (HOSPITAL_BASED_OUTPATIENT_CLINIC_OR_DEPARTMENT_OTHER)
Admission: RE | Admit: 2020-11-25 | Discharge: 2020-11-25 | Disposition: A | Discharge status: Home / Self Care | Payer: BC Managed Care – PPO | Attending: General Surgery | Admitting: General Surgery

## 2020-11-25 ENCOUNTER — Other Ambulatory Visit: Payer: Self-pay

## 2020-11-25 ENCOUNTER — Encounter (HOSPITAL_BASED_OUTPATIENT_CLINIC_OR_DEPARTMENT_OTHER)
Admission: RE | Disposition: A | Discharge status: Home / Self Care | Payer: Self-pay | Source: Home / Self Care | Attending: General Surgery

## 2020-11-25 DIAGNOSIS — N62 Hypertrophy of breast: Secondary | ICD-10-CM | POA: Insufficient documentation

## 2020-11-25 DIAGNOSIS — Z79890 Hormone replacement therapy (postmenopausal): Secondary | ICD-10-CM | POA: Insufficient documentation

## 2020-11-25 DIAGNOSIS — D0512 Intraductal carcinoma in situ of left breast: Secondary | ICD-10-CM | POA: Diagnosis not present

## 2020-11-25 DIAGNOSIS — Z79899 Other long term (current) drug therapy: Secondary | ICD-10-CM | POA: Insufficient documentation

## 2020-11-25 DIAGNOSIS — N6092 Unspecified benign mammary dysplasia of left breast: Secondary | ICD-10-CM

## 2020-11-25 DIAGNOSIS — N6082 Other benign mammary dysplasias of left breast: Secondary | ICD-10-CM | POA: Insufficient documentation

## 2020-11-25 DIAGNOSIS — Z20822 Contact with and (suspected) exposure to covid-19: Secondary | ICD-10-CM | POA: Insufficient documentation

## 2020-11-25 HISTORY — DX: Personal history of other diseases of the digestive system: Z87.19

## 2020-11-25 HISTORY — DX: Other complications of anesthesia, initial encounter: T88.59XA

## 2020-11-25 HISTORY — DX: Hypothyroidism, unspecified: E03.9

## 2020-11-25 HISTORY — PX: BREAST LUMPECTOMY: SHX2

## 2020-11-25 HISTORY — PX: RADIOACTIVE SEED GUIDED EXCISIONAL BREAST BIOPSY: SHX6490

## 2020-11-25 SURGERY — RADIOACTIVE SEED GUIDED BREAST BIOPSY
Anesthesia: General | Site: Breast | Laterality: Left

## 2020-11-25 MED ORDER — CIPROFLOXACIN IN D5W 400 MG/200ML IV SOLN
400.0000 mg | INTRAVENOUS | Status: AC
  Administered 2020-11-25: 400 mg via INTRAVENOUS

## 2020-11-25 MED ORDER — LACTATED RINGERS IV SOLN: INTRAVENOUS | Status: DC

## 2020-11-25 MED ORDER — ACETAMINOPHEN 500 MG PO TABS
ORAL_TABLET | ORAL | Status: AC
  Filled 2020-11-25: qty 2

## 2020-11-25 MED ORDER — FENTANYL CITRATE (PF) 100 MCG/2ML IJ SOLN
INTRAMUSCULAR | Status: DC | PRN
  Administered 2020-11-25: 100 ug via INTRAVENOUS

## 2020-11-25 MED ORDER — LIDOCAINE HCL (CARDIAC) PF 100 MG/5ML IV SOSY
PREFILLED_SYRINGE | INTRAVENOUS | Status: DC | PRN
  Administered 2020-11-25: 60 mg via INTRAVENOUS

## 2020-11-25 MED ORDER — FENTANYL CITRATE (PF) 100 MCG/2ML IJ SOLN
INTRAMUSCULAR | Status: AC
  Filled 2020-11-25: qty 2

## 2020-11-25 MED ORDER — MIDAZOLAM HCL 5 MG/5ML IJ SOLN
INTRAMUSCULAR | Status: DC | PRN
  Administered 2020-11-25: 2 mg via INTRAVENOUS

## 2020-11-25 MED ORDER — KETOROLAC TROMETHAMINE 15 MG/ML IJ SOLN
INTRAMUSCULAR | Status: AC
  Filled 2020-11-25: qty 1

## 2020-11-25 MED ORDER — MIDAZOLAM HCL 2 MG/2ML IJ SOLN
INTRAMUSCULAR | Status: AC
  Filled 2020-11-25: qty 2

## 2020-11-25 MED ORDER — PROPOFOL 10 MG/ML IV BOLUS
INTRAVENOUS | Status: AC
  Filled 2020-11-25: qty 20

## 2020-11-25 MED ORDER — ONDANSETRON HCL 4 MG/2ML IJ SOLN
INTRAMUSCULAR | Status: DC | PRN
  Administered 2020-11-25: 4 mg via INTRAVENOUS

## 2020-11-25 MED ORDER — PROPOFOL 10 MG/ML IV BOLUS
INTRAVENOUS | Status: DC | PRN
  Administered 2020-11-25: 200 mg via INTRAVENOUS

## 2020-11-25 MED ORDER — ONDANSETRON HCL 4 MG/2ML IJ SOLN
INTRAMUSCULAR | Status: AC
  Filled 2020-11-25: qty 2

## 2020-11-25 MED ORDER — KETOROLAC TROMETHAMINE 15 MG/ML IJ SOLN
15.0000 mg | INTRAMUSCULAR | Status: AC
  Administered 2020-11-25: 15 mg via INTRAVENOUS

## 2020-11-25 MED ORDER — LIDOCAINE 2% (20 MG/ML) 5 ML SYRINGE
INTRAMUSCULAR | Status: AC
  Filled 2020-11-25: qty 5

## 2020-11-25 MED ORDER — CIPROFLOXACIN IN D5W 400 MG/200ML IV SOLN
INTRAVENOUS | Status: AC
  Filled 2020-11-25: qty 200

## 2020-11-25 MED ORDER — HYDROMORPHONE HCL 1 MG/ML IJ SOLN: 0.2500 mg | INTRAMUSCULAR | Status: DC | PRN

## 2020-11-25 MED ORDER — ACETAMINOPHEN 500 MG PO TABS
1000.0000 mg | ORAL_TABLET | ORAL | Status: AC
  Administered 2020-11-25: 1000 mg via ORAL

## 2020-11-25 MED ORDER — BUPIVACAINE HCL (PF) 0.25 % IJ SOLN
INTRAMUSCULAR | Status: DC | PRN
  Administered 2020-11-25: 8 mL

## 2020-11-25 MED ORDER — DEXAMETHASONE SODIUM PHOSPHATE 4 MG/ML IJ SOLN
INTRAMUSCULAR | Status: DC | PRN
  Administered 2020-11-25: 10 mg via INTRAVENOUS

## 2020-11-25 SURGICAL SUPPLY — 39 items
ADH SKN CLS APL DERMABOND .7 (GAUZE/BANDAGES/DRESSINGS) ×1
APL PRP STRL LF DISP 70% ISPRP (MISCELLANEOUS) ×1
BINDER BREAST LRG (GAUZE/BANDAGES/DRESSINGS) IMPLANT
BINDER BREAST XLRG (GAUZE/BANDAGES/DRESSINGS) IMPLANT
BLADE SURG 15 STRL LF DISP TIS (BLADE) ×1 IMPLANT
BLADE SURG 15 STRL SS (BLADE) ×2
CHLORAPREP W/TINT 26 (MISCELLANEOUS) ×2 IMPLANT
COVER BACK TABLE 60X90IN (DRAPES) ×2 IMPLANT
COVER MAYO STAND STRL (DRAPES) ×2 IMPLANT
COVER PROBE W GEL 5X96 (DRAPES) ×2 IMPLANT
DERMABOND ADVANCED (GAUZE/BANDAGES/DRESSINGS) ×1
DERMABOND ADVANCED .7 DNX12 (GAUZE/BANDAGES/DRESSINGS) ×1 IMPLANT
DRAPE LAPAROSCOPIC ABDOMINAL (DRAPES) ×2 IMPLANT
DRAPE UTILITY XL STRL (DRAPES) ×2 IMPLANT
DRSG TEGADERM 4X4.75 (GAUZE/BANDAGES/DRESSINGS) IMPLANT
ELECT COATED BLADE 2.86 ST (ELECTRODE) ×2 IMPLANT
ELECT REM PT RETURN 9FT ADLT (ELECTROSURGICAL) ×2
ELECTRODE REM PT RTRN 9FT ADLT (ELECTROSURGICAL) ×1 IMPLANT
GLOVE BIO SURGEON STRL SZ 6.5 (GLOVE) ×1 IMPLANT
GLOVE BIO SURGEON STRL SZ7 (GLOVE) ×4 IMPLANT
GLOVE BIOGEL PI IND STRL 7.5 (GLOVE) ×1 IMPLANT
GLOVE BIOGEL PI INDICATOR 7.5 (GLOVE) ×1
GOWN STRL REUS W/ TWL LRG LVL3 (GOWN DISPOSABLE) ×2 IMPLANT
GOWN STRL REUS W/TWL LRG LVL3 (GOWN DISPOSABLE) ×4
KIT MARKER MARGIN INK (KITS) ×2 IMPLANT
NDL HYPO 25X1 1.5 SAFETY (NEEDLE) ×1 IMPLANT
NEEDLE HYPO 25X1 1.5 SAFETY (NEEDLE) ×2 IMPLANT
PACK BASIN DAY SURGERY FS (CUSTOM PROCEDURE TRAY) ×2 IMPLANT
PENCIL SMOKE EVACUATOR (MISCELLANEOUS) ×2 IMPLANT
SLEEVE SCD COMPRESS KNEE MED (MISCELLANEOUS) ×2 IMPLANT
SPONGE LAP 4X18 RFD (DISPOSABLE) ×2 IMPLANT
SUT MON AB 5-0 PS2 18 (SUTURE) ×1 IMPLANT
SUT VIC AB 2-0 SH 27 (SUTURE) ×2
SUT VIC AB 2-0 SH 27XBRD (SUTURE) ×1 IMPLANT
SUT VIC AB 3-0 SH 27 (SUTURE) ×2
SUT VIC AB 3-0 SH 27X BRD (SUTURE) ×1 IMPLANT
SYR CONTROL 10ML LL (SYRINGE) ×2 IMPLANT
TOWEL GREEN STERILE FF (TOWEL DISPOSABLE) ×2 IMPLANT
TRAY FAXITRON CT DISP (TRAY / TRAY PROCEDURE) ×2 IMPLANT

## 2020-11-25 NOTE — Anesthesia Postprocedure Evaluation (Signed)
Anesthesia Post Note  Patient: Victoria [name]  Procedure(s) Performed: LEFT BREAST RADIOACTIVE SEED GUIDED EXCISIONAL BREAST BIOPSY (Left Breast)     Patient location during evaluation: PACU Anesthesia Type: General Level of consciousness: awake and alert Pain management: pain level controlled Vital Signs Assessment: post-procedure vital signs reviewed and stable Respiratory status: spontaneous breathing, nonlabored ventilation and respiratory function stable Cardiovascular status: blood pressure returned to baseline and stable Postop Assessment: no apparent nausea or vomiting Anesthetic complications: no   No complications documented.  Last Vitals:  Vitals:   11/25/20 1645 11/25/20 1700  BP: 121/84 114/78  Pulse: 91 71  Resp: (!) 25 11  Temp:    SpO2: 100% 100%    Last Pain:  Vitals:   11/25/20 1700  TempSrc:   PainSc: 0-No pain                 Alexzavier Girardin,W. EDMOND

## 2020-11-25 NOTE — Discharge Instructions (Signed)
Post Anesthesia Home Care Instructions  Activity: Get plenty of rest for the remainder of the day. A responsible individual must stay with you for 24 hours following the procedure.  For the next 24 hours, DO NOT: -Drive a car -Paediatric nurse -Drink alcoholic beverages -Take any medication unless instructed by your physician -Make any legal decisions or sign important papers.  Meals: Start with liquid foods such as gelatin or soup. Progress to regular foods as tolerated. Avoid greasy, spicy, heavy foods. If nausea and/or vomiting occur, drink only clear liquids until the nausea and/or vomiting subsides. Call your physician if vomiting continues.  Special Instructions/Symptoms: Your throat may feel dry or sore from the anesthesia or the breathing tube placed in your throat during surgery. If this causes discomfort, gargle with warm salt water. The discomfort should disappear within 24 hours.  If you had a scopolamine patch placed behind your ear for the management of post- operative nausea and/or vomiting:  1. The medication in the patch is effective for 72 hours, after which it should be removed.  Wrap patch in a tissue and discard in the trash. Wash hands thoroughly with soap and water. 2. You may remove the patch earlier than 72 hours if you experience unpleasant side effects which may include dry mouth, dizziness or visual disturbances. 3. Avoid touching the patch. Wash your hands with soap and water after contact with the patch.       [place] Office Phone Number [phone]  BREAST BIOPSY/ PARTIAL MASTECTOMY: POST OP INSTRUCTIONS Take 400 mg of ibuprofen every 8 hours or 650 mg tylenol every 6 hours for next 72 hours then as needed. Use ice several times daily also. Always review your discharge instruction sheet given to you by the facility where your surgery was performed.  IF YOU HAVE DISABILITY OR FAMILY LEAVE FORMS, YOU MUST BRING THEM TO THE OFFICE  FOR PROCESSING.  DO NOT GIVE THEM TO YOUR DOCTOR.  1. A prescription for pain medication may be given to you upon discharge.  Take your pain medication as prescribed, if needed.  If narcotic pain medicine is not needed, then you may take acetaminophen (Tylenol), naprosyn (Alleve) or ibuprofen (Advil) as needed. 2. Take your usually prescribed medications unless otherwise directed 3. If you need a refill on your pain medication, please contact your pharmacy.  They will contact our office to request authorization.  Prescriptions will not be filled after 5pm or on week-ends. 4. You should eat very light the first 24 hours after surgery, such as soup, crackers, pudding, etc.  Resume your normal diet the day after surgery. 5. Most patients will experience some swelling and bruising in the breast.  Ice packs and a good support bra will help.  Wear the breast binder provided or a sports bra for 72 hours day and night.  After that wear a sports bra during the day until you return to the office. Swelling and bruising can take several days to resolve.  6. It is common to experience some constipation if taking pain medication after surgery.  Increasing fluid intake and taking a stool softener will usually help or prevent this problem from occurring.  A mild laxative (Milk of Magnesia or Miralax) should be taken according to package directions if there are no bowel movements after 48 hours. 7. Unless discharge instructions indicate otherwise, you may remove your bandages 48 hours after surgery and you may shower at that time.  You may have steri-strips (small skin tapes)  in place directly over the incision.  These strips should be left on the skin for 7-10 days and will come off on their own.  If your surgeon used skin glue on the incision, you may shower in 24 hours.  The glue will flake off over the next 2-3 weeks.  Any sutures or staples will be removed at the office during your follow-up visit. 8. ACTIVITIES:  You  may resume regular daily activities (gradually increasing) beginning the next day.  Wearing a good support bra or sports bra minimizes pain and swelling.  You may have sexual intercourse when it is comfortable. a. You may drive when you no longer are taking prescription pain medication, you can comfortably wear a seatbelt, and you can safely maneuver your car and apply brakes. b. RETURN TO WORK:  ______________________________________________________________________________________ 9. You should see your doctor in the office for a follow-up appointment approximately two weeks after your surgery.  Your doctors nurse will typically make your follow-up appointment when she calls you with your pathology report.  Expect your pathology report 3-4 business days after your surgery.  You may call to check if you do not hear from [place] after three days. 10. OTHER INSTRUCTIONS: _______________________________________________________________________________________________ _____________________________________________________________________________________________________________________________________ _____________________________________________________________________________________________________________________________________ _____________________________________________________________________________________________________________________________________  WHEN TO CALL DR WAKEFIELD: 1. Fever over 101.0 2. Nausea and/or vomiting. 3. Extreme swelling or bruising. 4. Continued bleeding from incision. 5. Increased pain, redness, or drainage from the incision.  The clinic staff is available to answer your questions during regular business hours.  Please dont hesitate to call and ask to speak to one of the nurses for clinical concerns.  If you have a medical emergency, go to the nearest emergency room or call 911.  A surgeon from [facility] is always on call at the hospital.  For further  questions, please visit [email or web address]

## 2020-11-25 NOTE — Interval H&P Note (Signed)
History and Physical Interval Note:  11/25/2020 2:34 PM  Victoria [name]  has presented today for surgery, with the diagnosis of LEFT BREAST ATYPICAL DUCTAL HYPERPLASIA.  The various methods of treatment have been discussed with the patient and family. After consideration of risks, benefits and other options for treatment, the patient has consented to  Procedure(s): LEFT BREAST RADIOACTIVE SEED GUIDED EXCISIONAL BREAST BIOPSY (Left) as a surgical intervention.  The patient's history has been reviewed, patient examined, no change in status, stable for surgery.  I have reviewed the patient's chart and labs.  Questions were answered to the patient's satisfaction.     Rolm Bookbinder

## 2020-11-25 NOTE — H&P (Signed)
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 she has no mass or dc. she does not have fh of any cancers. she also has been diagnosed with worse gerd and what sounds like a PEH. she is interested at some point in seeing someone about that. she had mm that shows b density breasts. there were left breast calcifications measuring 6 mm noted in central breast. biopsy shows focal ADH in a CSL. she then had mri in [place] that shows ( I have images and have reviewed) only a 1.2x1x0.9 cm area of nme in the location of this biopsy. otherwise normal. she is here to discuss options today   Past Surgical History (Chanel Teressa Senter, [place]; 10/28/2020 9:46 AM) Breast Biopsy  Left. multiple Cesarean Section - Multiple  Colon Polyp Removal - Colonoscopy  Colon Polyp Removal - Open   Diagnostic Studies History (Chanel Teressa Senter, CMA; 10/28/2020 9:46 AM) Colonoscopy  1-5 years ago Mammogram  within last year Pap Smear  1-5 years ago  Allergies (Chanel Teressa Senter, CMA; 10/28/2020 9:49 AM) Penicillins  Adhesive Tape *MEDICAL DEVICES AND SUPPLIES*  Serotonin HCl *CHEMICALS*  Allergies Reconciled   Medication History (Chanel Teressa Senter, CMA; 10/28/2020 9:49 AM) Pantoprazole Sodium (40MG  Tablet DR, Oral) Active. Liothyronine Sodium (5MCG Tablet, Oral) Active. Vitamin D (Ergocalciferol) (1.25 MG(50000 UT) Capsule, Oral) Active. Irbesartan (75MG  Tablet, Oral) Active. Levothyroxine Sodium (75MCG Tablet, Oral) Active. Medications Reconciled  Social History Antonietta Jewel, CMA; 10/28/2020 9:46 AM) Alcohol use  Occasional alcohol use. Caffeine use  Tea. No caffeine use  No drug use  Tobacco use  Never smoker.  Family History Antonietta Jewel, [place]; 10/28/2020 9:46 AM) Alcohol Abuse  Brother. Cancer   Brother. Colon Polyps  Daughter, Father, Son. Hypertension  Father, Mother. Ischemic Bowel Disease  Father. Migraine Headache  Son. Respiratory Condition  Father, Mother. Thyroid problems  Daughter, Mother.  Pregnancy / Birth History Antonietta Jewel, [place]; 10/28/2020 9:46 AM) Age at menarche  40 years. Age of menopause  29-50 Contraceptive History  Oral contraceptives. Gravida  2 Length (months) of breastfeeding  7-12 Maternal age  28-30 Para  2 Regular periods   Other Problems (Chanel Teressa Senter, CMA; 10/28/2020 9:46 AM) Bladder Problems  Cancer  Gastroesophageal Reflux Disease  General anesthesia - complications  Heart murmur  High blood pressure  Hypercholesterolemia  Melanoma  Other disease, cancer, significant illness  Thyroid Disease    Review of Systems (Chanel Nolan CMA; 10/28/2020 9:46 AM) General Not Present- Appetite Loss, Chills, Fatigue, Fever, Night Sweats, Weight Gain and Weight Loss. Skin Present- Dryness. Not Present- Change in Wart/Mole, Hives, Jaundice, New Lesions, Non-Healing Wounds, Rash and Ulcer. HEENT Not Present- Earache, Hearing Loss, Hoarseness, Nose Bleed, Oral Ulcers, Ringing in the Ears, Seasonal Allergies, Sinus Pain, Sore Throat, Visual Disturbances, Wears glasses/contact lenses and Yellow Eyes. Respiratory Present- Snoring. Not Present- Bloody sputum, Chronic Cough, Difficulty Breathing and Wheezing. Cardiovascular Present- Swelling of Extremities. Not Present- Chest Pain, Difficulty Breathing Lying Down, Leg Cramps, Palpitations, Rapid Heart Rate and Shortness of Breath. Gastrointestinal Present- Indigestion. Not Present- Abdominal Pain, Bloating, Bloody Stool, Change in Bowel Habits, Chronic diarrhea, Constipation, Difficulty Swallowing, Excessive gas, Gets full quickly at meals, Hemorrhoids, Nausea, Rectal Pain and Vomiting. Female Genitourinary Present- Frequency. Not Present- Nocturia, Painful Urination, Pelvic Pain and  Urgency. Musculoskeletal Not Present- Back  Pain, Joint Pain, Joint Stiffness, Muscle Pain, Muscle Weakness and Swelling of Extremities. Neurological Not Present- Decreased Memory, Fainting, Headaches, Numbness, Seizures, Tingling, Tremor, Trouble walking and Weakness. Psychiatric Not Present- Anxiety, Bipolar, Change in Sleep Pattern, Depression, Fearful and Frequent crying. Endocrine Not Present- Cold Intolerance, Excessive Hunger, Hair Changes, Heat Intolerance, Hot flashes and New Diabetes. Hematology Not Present- Blood Thinners, Easy Bruising, Excessive bleeding, Gland problems, HIV and Persistent Infections.  Vitals (Chanel Nolan CMA; 10/28/2020 9:50 AM) 10/28/2020 9:49 AM Weight: 155.13 lb Height: 59.5in Body Surface Area: 1.67 m Body Mass Index: 30.81 kg/m  Temp.: 97.55F  Pulse: 85 (Regular)  BP: 128/74(Sitting, Left Arm, Standard) Physical Exam Rolm Bookbinder MD; 10/28/2020 11:58 AM) General Mental Status-Alert. Orientation-Oriented X3.  Breast Nipples-No Discharge. Breast Lump-No Palpable Breast Mass.  Lymphatic Head & Neck  General Head & Neck Lymphatics: Bilateral - Description - Normal. Axillary  General Axillary Region: Bilateral - Description - Normal. Note: no [place] adenopathy   Assessment & Plan Rolm Bookbinder MD; 10/28/2020 11:59 AM) ATYPICAL DUCTAL HYPERPLASIA OF LEFT BREAST (N60.92) Story: discussed seed guided excision of area of adh with abnormal mri as well. will plan to do this soon, discussed surgery and recovery. after that will discuss how best to follow here and options. discussed 15-20% upgrade risk given constellation of findings.

## 2020-11-25 NOTE — Op Note (Signed)
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 she has no mass or dc. she does not have fh of any cancers. she also has been diagnosed with worse gerd and what sounds like a PEH. she is interested at some point in seeing someone about that. she had mm that shows b density breasts. there were left breast calcifications measuring 6 mm noted in central breast. biopsy shows focal ADH in a CSL. she then had mri in [place] that shows ( I have images and have reviewed) only a 1.2x1x0.9 cm area of nme in the location of this biopsy. otherwise normal. We discussed seed localized excision  Procedure: After informed consent obtained she was taken to the OR.  She was given abx. SCDs were in placed. She was placed under general anesthesia. She was prepped and draped in standard sterile surgical fashion. A timeout was performed.  I located the seed in the central breast. I then made a periareolar incision after infiltrating with marcaine.  This was done to hide the scar later. I then dissected to remove the seed and the surrounding tissue.   Mammogram confirmed removal of seed and clip.  Hemostasis observed.  I closed with 2-0 vicryl for breast tissue. The skin was closed with 3-0 vicryl, 4-0 monocryl glue and steristrips. She was awakened in the OR and transferred to pacu stable.

## 2020-11-25 NOTE — Anesthesia Procedure Notes (Signed)
Procedure Name: LMA Insertion Performed by: Tiwatope Emmitt, Lorimor, CRNA Pre-anesthesia Checklist: Patient identified, Emergency Drugs available, Suction available and Patient being monitored Patient Re-evaluated:Patient Re-evaluated prior to induction Oxygen Delivery Method: Circle system utilized Preoxygenation: Pre-oxygenation with 100% oxygen Induction Type: IV induction Ventilation: Mask ventilation without difficulty LMA: LMA inserted LMA Size: 3.0 Number of attempts: 1 Airway Equipment and Method: Bite block Placement Confirmation: positive ETCO2 Tube secured with: Tape Dental Injury: Teeth and Oropharynx as per pre-operative assessment        

## 2020-11-25 NOTE — Transfer of Care (Signed)
Immediate Anesthesia Transfer of Care Note  Patient: Victoria [name]  Procedure(s) Performed: LEFT BREAST RADIOACTIVE SEED GUIDED EXCISIONAL BREAST BIOPSY (Left Breast)  Patient Location: PACU  Anesthesia Type:General  Level of Consciousness: awake  Airway & Oxygen Therapy: Patient Spontanous Breathing and Patient connected to face mask oxygen  Post-op Assessment: Report given to RN and Post -op Vital signs reviewed and stable  Post vital signs: Reviewed and stable  Last Vitals:  Vitals Value Taken Time  BP 117/64 11/25/20 1633  Temp    Pulse 83 11/25/20 1636  Resp 15 11/25/20 1636  SpO2 100 % 11/25/20 1636  Vitals shown include unvalidated device data.  Last Pain:  Vitals:   11/25/20 1338  TempSrc: Oral  PainSc: 0-No pain      Patients Stated Pain Goal: 3 (58/09/98 3382)  Complications: No complications documented.

## 2020-11-25 NOTE — Anesthesia Preprocedure Evaluation (Addendum)
Anesthesia Evaluation  Patient identified by MRN, date of birth, ID band Patient awake    Reviewed: Allergy & Precautions, H&P , NPO status , Patient's Chart, lab work & pertinent test results  Airway Mallampati: III  TM Distance: >3 FB Neck ROM: Full    Dental no notable dental hx. (+) Teeth Intact, Dental Advisory Given   Pulmonary neg pulmonary ROS,    Pulmonary exam normal breath sounds clear to auscultation       Cardiovascular hypertension, Pt. on medications  Rhythm:Regular Rate:Normal     Neuro/Psych negative neurological ROS  negative psych ROS   GI/Hepatic Neg liver ROS, hiatal hernia, GERD  Medicated,  Endo/Other  Hypothyroidism   Renal/GU negative Renal ROS  negative genitourinary   Musculoskeletal   Abdominal   Peds  Hematology negative hematology ROS (+)   Anesthesia Other Findings   Reproductive/Obstetrics negative OB ROS                            Anesthesia Physical Anesthesia Plan  ASA: II  Anesthesia Plan: General   Post-op Pain Management:    Induction: Intravenous  PONV Risk Score and Plan: 4 or greater and Ondansetron, Dexamethasone and Midazolam  Airway Management Planned: LMA  Additional Equipment:   Intra-op Plan:   Post-operative Plan: Extubation in OR  Informed Consent: I have reviewed the patients History and Physical, chart, labs and discussed the procedure including the risks, benefits and alternatives for the proposed anesthesia with the patient or authorized representative who has indicated his/her understanding and acceptance.     Dental advisory given  Plan Discussed with: CRNA  Anesthesia Plan Comments:         Anesthesia Quick Evaluation

## 2020-11-26 ENCOUNTER — Encounter (HOSPITAL_BASED_OUTPATIENT_CLINIC_OR_DEPARTMENT_OTHER): Payer: Self-pay | Admitting: General Surgery

## 2020-12-01 LAB — SURGICAL PATHOLOGY

## 2020-12-07 NOTE — Telephone Encounter (Signed)
error 

## 2021-06-16 ENCOUNTER — Other Ambulatory Visit: Payer: Self-pay | Admitting: Obstetrics and Gynecology

## 2021-06-16 DIAGNOSIS — Z1231 Encounter for screening mammogram for malignant neoplasm of breast: Secondary | ICD-10-CM

## 2021-08-10 ENCOUNTER — Ambulatory Visit: Payer: BC Managed Care – PPO

## 2021-09-12 ENCOUNTER — Ambulatory Visit: Payer: BC Managed Care – PPO

## 2021-10-20 ENCOUNTER — Ambulatory Visit: Payer: BC Managed Care – PPO

## 2021-11-09 ENCOUNTER — Other Ambulatory Visit: Payer: Self-pay

## 2021-11-09 ENCOUNTER — Ambulatory Visit
Admission: RE | Admit: 2021-11-09 | Discharge: 2021-11-09 | Disposition: A | Discharge status: Home / Self Care | Payer: BC Managed Care – PPO | Source: Ambulatory Visit | Attending: Obstetrics and Gynecology | Admitting: Obstetrics and Gynecology

## 2021-11-09 DIAGNOSIS — Z1231 Encounter for screening mammogram for malignant neoplasm of breast: Secondary | ICD-10-CM

## 2021-11-10 ENCOUNTER — Other Ambulatory Visit: Payer: Self-pay | Admitting: Obstetrics and Gynecology

## 2021-11-10 DIAGNOSIS — Z1231 Encounter for screening mammogram for malignant neoplasm of breast: Secondary | ICD-10-CM

## 2021-12-30 ENCOUNTER — Other Ambulatory Visit: Payer: Self-pay | Admitting: Gastroenterology

## 2022-01-24 ENCOUNTER — Encounter (HOSPITAL_COMMUNITY): Payer: Self-pay

## 2022-10-16 ENCOUNTER — Other Ambulatory Visit: Payer: Self-pay | Admitting: Obstetrics and Gynecology

## 2022-10-16 DIAGNOSIS — Z853 Personal history of malignant neoplasm of breast: Secondary | ICD-10-CM

## 2022-11-10 ENCOUNTER — Ambulatory Visit
Admission: RE | Admit: 2022-11-10 | Discharge: 2022-11-10 | Disposition: A | Discharge status: Home / Self Care | Payer: BC Managed Care – PPO | Source: Ambulatory Visit | Attending: Obstetrics and Gynecology | Admitting: Obstetrics and Gynecology

## 2022-11-10 ENCOUNTER — Other Ambulatory Visit: Payer: Self-pay | Admitting: Obstetrics and Gynecology

## 2022-11-10 DIAGNOSIS — Z853 Personal history of malignant neoplasm of breast: Secondary | ICD-10-CM

## 2022-11-13 ENCOUNTER — Telehealth: Payer: Self-pay | Admitting: Gastroenterology

## 2022-11-13 NOTE — Telephone Encounter (Signed)
Spoke with the patient. She has concerns about her hemorrhoids. Denies bleeding or tenderness from the rectum. She feels she has difficulty with cleaning after her bowel movements. She generally has daily bowel movements except when traveling. She does not consider herself to suffer with constipation. We discuss healthy bowel habits, diet, and stool softeners PRN. No red flags of symptoms at this time that sound urgent. She understands she can discuss the risks and benefits of hemorrhoid banding with her provider at an office visit. Discussed GERD. She has lost weight and she is extremely careful with her diet. She avoids gluten. She focuses on keto diet. She mostly feels she has it under control, but would like to discuss options of more procedural intervention of the hiatal hernia. Again encouraged her to discuss with her provider at an office visit. She is not sure when she will be back in [place] and ex[presses hope that she can be seen while she is here. I will monitor the schedule for any cancellations.

## 2022-11-13 NOTE — Telephone Encounter (Signed)
Inbound call from patient stating that she is having issues again with a hernia and hemorrhoids. Patient stated that she is in this month from [place] and is wanting to make an appointment with Dr. Silverio Decamp. Patient is requesting a call back to discuss if that is possible. Patient stated she will be going back to [place] on 12/30. Please advise.

## 2022-11-14 NOTE — Telephone Encounter (Signed)
No appointment openings at this time.

## 2022-11-22 NOTE — Telephone Encounter (Signed)
Monitoring the schedule. Still no cancellations.

## 2023-05-08 ENCOUNTER — Other Ambulatory Visit: Payer: Self-pay | Admitting: Family

## 2023-05-08 DIAGNOSIS — N6320 Unspecified lump in the left breast, unspecified quadrant: Secondary | ICD-10-CM

## 2023-05-10 ENCOUNTER — Ambulatory Visit
Admission: RE | Admit: 2023-05-10 | Discharge: 2023-05-10 | Disposition: A | Discharge status: Home / Self Care | Payer: BC Managed Care – PPO | Source: Ambulatory Visit | Attending: Family | Admitting: Family

## 2023-05-10 DIAGNOSIS — N6320 Unspecified lump in the left breast, unspecified quadrant: Secondary | ICD-10-CM

## 2023-05-17 ENCOUNTER — Other Ambulatory Visit: Payer: BC Managed Care – PPO

## 2023-06-28 ENCOUNTER — Other Ambulatory Visit: Payer: Self-pay | Admitting: Gastroenterology

## 2023-10-15 ENCOUNTER — Other Ambulatory Visit: Payer: Self-pay | Admitting: Obstetrics and Gynecology

## 2023-10-15 DIAGNOSIS — Z0000 Encounter for general adult medical examination without abnormal findings: Secondary | ICD-10-CM

## 2023-11-21 ENCOUNTER — Ambulatory Visit
Admission: RE | Admit: 2023-11-21 | Discharge: 2023-11-21 | Disposition: A | Discharge status: Home / Self Care | Payer: BC Managed Care – PPO | Source: Ambulatory Visit | Attending: Obstetrics and Gynecology | Admitting: Obstetrics and Gynecology

## 2023-11-21 DIAGNOSIS — Z0000 Encounter for general adult medical examination without abnormal findings: Secondary | ICD-10-CM

## 2023-11-23 ENCOUNTER — Other Ambulatory Visit: Payer: Self-pay | Admitting: Obstetrics and Gynecology

## 2023-11-23 DIAGNOSIS — Z9889 Other specified postprocedural states: Secondary | ICD-10-CM

## 2023-11-27 ENCOUNTER — Ambulatory Visit
Admission: RE | Admit: 2023-11-27 | Discharge: 2023-11-27 | Disposition: A | Discharge status: Home / Self Care | Payer: BC Managed Care – PPO | Source: Ambulatory Visit | Attending: Obstetrics and Gynecology | Admitting: Obstetrics and Gynecology

## 2023-11-27 ENCOUNTER — Other Ambulatory Visit: Payer: Self-pay | Admitting: Obstetrics and Gynecology

## 2023-11-27 DIAGNOSIS — R921 Mammographic calcification found on diagnostic imaging of breast: Secondary | ICD-10-CM

## 2023-11-27 DIAGNOSIS — Z9889 Other specified postprocedural states: Secondary | ICD-10-CM

## 2023-11-27 DIAGNOSIS — N63 Unspecified lump in unspecified breast: Secondary | ICD-10-CM

## 2023-11-29 ENCOUNTER — Ambulatory Visit
Admission: RE | Admit: 2023-11-29 | Discharge: 2023-11-29 | Disposition: A | Discharge status: Home / Self Care | Payer: BC Managed Care – PPO | Source: Ambulatory Visit | Attending: Obstetrics and Gynecology | Admitting: Obstetrics and Gynecology

## 2023-11-29 DIAGNOSIS — R921 Mammographic calcification found on diagnostic imaging of breast: Secondary | ICD-10-CM

## 2023-11-29 HISTORY — PX: BREAST BIOPSY: SHX20

## 2023-11-30 LAB — SURGICAL PATHOLOGY

## 2024-02-29 ENCOUNTER — Telehealth: Payer: Self-pay | Admitting: Gastroenterology

## 2024-02-29 NOTE — Telephone Encounter (Signed)
 Inbound call from patient stating that she wanted to schedule her colonoscopy with Dr. Lavon Paganini for July. Patient stated that she wanted to be seen before because she wanted to address some current symptoms. I advised patient that we needed to schedule the office visit first to discuss her symptoms and from that appointment she could schedule her colonoscopy. Patient was scheduled for 5/20 at 11:00 with Doug Sou and wanted me to send a message to see if there was anyway she could go ahead and schedule her colonoscopy for July 1st. Patient states she lives in [place] and will be in for the first part of July and it would be easier to go ahead and schedule. Requesting a call back to discuss. Please advise.

## 2024-03-03 NOTE — Telephone Encounter (Signed)
 Appointment for colonoscopy on 06/10/24 at 12:30 scheduled for the patient. Spoke with patient. Explained that the insurance authorization and the instructions will be taken care of at her appointment in May. Patient agrees to this plan.

## 2024-04-29 ENCOUNTER — Encounter: Payer: Self-pay | Admitting: Gastroenterology

## 2024-04-29 ENCOUNTER — Ambulatory Visit (INDEPENDENT_AMBULATORY_CARE_PROVIDER_SITE_OTHER): Admitting: Gastroenterology

## 2024-04-29 VITALS — BP 130/68 | HR 68 | Ht 59.0 in | Wt 146.0 lb

## 2024-04-29 DIAGNOSIS — K219 Gastro-esophageal reflux disease without esophagitis: Secondary | ICD-10-CM | POA: Diagnosis not present

## 2024-04-29 DIAGNOSIS — K648 Other hemorrhoids: Secondary | ICD-10-CM | POA: Diagnosis not present

## 2024-04-29 DIAGNOSIS — Z860100 Personal history of colon polyps, unspecified: Secondary | ICD-10-CM | POA: Diagnosis not present

## 2024-04-29 DIAGNOSIS — K449 Diaphragmatic hernia without obstruction or gangrene: Secondary | ICD-10-CM

## 2024-04-29 MED ORDER — NA SULFATE-K SULFATE-MG SULF 17.5-3.13-1.6 GM/177ML PO SOLN: 1.0000 | ORAL | 0 refills | Status: DC

## 2024-04-29 NOTE — Patient Instructions (Addendum)
 You have been scheduled for a colonoscopy. Please follow written instructions given to you at your visit today.   If you use inhalers (even only as needed), please bring them with you on the day of your procedure.  DO NOT TAKE 7 DAYS PRIOR TO TEST- Trulicity (dulaglutide) Ozempic, Wegovy (semaglutide) Mounjaro (tirzepatide) Bydureon Bcise (exanatide extended release)  DO NOT TAKE 1 DAY PRIOR TO YOUR TEST Rybelsus (semaglutide) Adlyxin (lixisenatide) Victoza (liraglutide) Byetta (exanatide) ___________________________________________________________________________  Dr Leonia Raman currently has no July banding appointments open. After your colon hopefully she would be able to work you in.   I appreciate the opportunity to care for you. Jessica Zehr, PA

## 2024-04-29 NOTE — Progress Notes (Signed)
 04/29/2024 Victoria [name] [id number] 09-18-1961   HISTORY OF PRESENT ILLNESS: This is a 63 year old female who is a patient of Dr. Allean Aran.  She follows here regularly for colonoscopies.  She is due for colonoscopy in July and already has that scheduled.  She also wanted discussed hemorrhoid banding.  Says that she has a hemorrhoid that protrudes and makes it difficult for her to clean.  No rectal pain.    She also wanted to discuss possible TIF procedure.  Has acid reflux, only uses her PPI as needed.  Says she has osteoporosis and does not want that to worsen.  Colonoscopy 06/2019:  - One 6 mm polyp in the descending colon, removed with a cold snare. Resected and retrieved. - Non- bleeding internal hemorrhoids. - The examination was otherwise normal.  Surgical [P], colon, descending, polyp - TUBULAR ADENOMA. - NO HIGH GRADE DYSPLASIA OR MALIGNANCY.  EGD 07/2020:  - Z- line regular, 32 cm from the incisors. - Gastroesophageal flap valve classified as Hill Grade IV ( no fold, wide open lumen, hiatal hernia present) . - 5 cm hiatal hernia. - Gastritis. Biopsied. - Normal first portion of the duodenum and second portion of the duodenum. Biopsied.  1. Surgical [P], duodenal bulb, 2nd portion of duodenum and distal duodenum - BENIGN DUODENAL MUCOSA - NO ACUTE INFLAMMATION, VILLOUS BLUNTING OR INCREASED INTRAEPITHELIAL LYMPHOCYTES IDENTIFIED 2. Surgical [P], gastric antrum and gastric body - FUNDIC GLAND POLYP(S) - BENIGN GASTRIC MUCOSA - NO H. PYLORI, INTESTINAL METAPLASIA OR MALIGNANCY IDENTIFIED  Past Medical History:  Diagnosis Date   Allergy    Cancer (HCC)    skin cancer leg 2020   Complication of anesthesia    hard to wake up after Versed    Constipation    occ- diet changes help- not a chronic issue    GERD (gastroesophageal reflux disease)    Heart murmur    History of hiatal hernia    Hyperlipidemia    Hypertension    Hypothyroidism    Osteoporosis    Thyroid  disease    Past Surgical History:  Procedure Laterality Date   bladder distention     BREAST BIOPSY Left 11/29/2023   MM LT BREAST BX W LOC DEV 1ST LESION IMAGE BX SPEC STEREO GUIDE 11/29/2023 [facility]   BREAST EXCISIONAL BIOPSY Left    x2 Benign   BREAST LUMPECTOMY Left 11/25/2020   RADIOACTIVE SEED GUIDED EXCISIONAL BREAST BIOPSY Left 11/25/2020   Procedure: LEFT BREAST RADIOACTIVE SEED GUIDED EXCISIONAL BREAST BIOPSY;  Surgeon: Enid Harry, MD;  Location: [facility];  Service: General;  Laterality: Left;   right cyst removed  2012   from ovary     reports that she has never smoked. She has never used smokeless tobacco. She reports current alcohol use. She reports that she does not currently use drugs. family history includes Colon cancer in her paternal grandmother; Colon polyps in her father; Esophageal cancer in her brother; Lung cancer in her brother. Allergies  Allergen Reactions   Barium-Containing Compounds Itching    Facial itching. Pt has gluten allergies and feels it must be in it.  Not sure about IV contrast.   Penicillins     REACTION: rash   Serotonin Other (See Comments)    Felt head was going to explode   Tape Itching      Outpatient Encounter Medications as of 04/29/2024  Medication Sig   ascorbic acid (VITAMIN C) 1000 MG tablet Take 1 tablet  by mouth daily.   irbesartan (AVAPRO) 75 MG tablet Take 75 mg by mouth daily.   Levothyroxine Sodium (TIROSINT) 75 MCG CAPS Take by mouth daily before breakfast.   liothyronine (CYTOMEL) 5 MCG tablet Take 5 mcg by mouth daily.   OVER THE COUNTER MEDICATION Fish Oil One capsule daily.   OVER THE COUNTER MEDICATION Vitamin D 3 one capsule daily. (Patient not taking: No sig reported)   OVER THE COUNTER MEDICATION B Complex vitamin, one tablet daily.    pantoprazole  (PROTONIX ) 40 MG tablet Take 1 tablet (40 mg total) by mouth daily.   pantoprazole  (PROTONIX ) 40 MG tablet TAKE 1 TABLET BY MOUTH  ONCE DAILY 30  MINUTES  BEFORE  DINNER   spironolactone (ALDACTONE) 50 MG tablet Take 50 mg by mouth daily.   Vitamin D, Ergocalciferol, (DRISDOL) 1.25 MG (50000 UNIT) CAPS capsule Take 50,000 Units by mouth once a week.   No facility-administered encounter medications on file as of 04/29/2024.     REVIEW OF SYSTEMS  : All other systems reviewed and negative except where noted in the History of Present Illness.   PHYSICAL EXAM: Ht 4\' 11"  (1.499 m)   Wt 146 lb (66.2 kg)   LMP 12/11/2014   BMI 29.49 kg/m  General: Well developed white female in no acute distress Head: Normocephalic and atraumatic Eyes:  Sclerae anicteric, conjunctiva pink. Ears: Normal auditory acuity Lungs: Clear throughout to auscultation; no W/R/R. Heart: Regular rate and rhythm; no M/R/G. Rectal:  Will be done at the time of colonoscopy. Musculoskeletal: Symmetrical with no gross deformities  Skin: No lesions on visible extremities Neurological: Alert oriented x 4, grossly non-focal Psychological:  Alert and cooperative. Normal mood and affect  ASSESSMENT AND PLAN: *Personal history of colon polyps:  Last colonoscopy 04/2019 with 5 year recall.  Already scheduled with Dr. Nandigam for July.   *Hemorrhoids: Would like these addressed.  Internal hemorrhoids noted on previous colonoscopy.  Consider internal hemorrhoid banding after colonoscopy.  Advised that external hemorrhoids would need to be addressed surgically, not banded here. *GERD:  Asking about TIF.  Advised I do not think she is a candidate because of her large hiatal hernia.  Will have Dr. Karene Oto review.  Only uses her PPI as needed.  Says that she has osteoporosis obviously concerned about using it regularly   CC:  Thurman Flores, MD

## 2024-04-30 ENCOUNTER — Telehealth: Payer: Self-pay

## 2024-04-30 NOTE — Telephone Encounter (Signed)
-----   Message from Curran D. Zehr sent at 04/30/2024  4:52 PM EDT ----- Can you please let the patient know that I talked to Dr. Karene Oto and had him review her chart.  He said that she is not a candidate for TIF alone because of her large hiatal hernia.  Please relay to her his message from below about needing hiatal hernia repair, could consider laparoscopic hiatal hernia repair in conjunction with TIF versus surgical hernia repair with fundoplication.  I asked him what the benefit would be doing the TIF in conjunction with the hiatal hernia repair versus just doing the surgical repair with fundoplication and his response was that the efficacy is about the same upfront for both, but the 10-year data on TIF does seem to fair a bit better than traditional surgical fundoplication (about 84% of patients still do not take PPIs at 10 years with TIF, vs 70% for laparoscopic fundoplication).  Additionally, healing times are definitely faster with TIF.  So essentially it would be repairing the hiatal hernia and then doing a wrap of some sort either via fundoplication surgically or via the endoscopic TIF procedure.  Hopefully that helps and if she would like to see and discuss with a surgeon or discuss with Dr. Karene Oto then we certainly can place a surgical referral or schedule with Dr. Karene Oto to discuss.  Thank you,  Jess ----- Message ----- From: Annis Kinder, DO Sent: 04/29/2024   4:49 PM EDT To: Cortez Dines, PA-C  Chart reviewed.  EGD in 07/2020 with a large 5 cm hiatal hernia and Hill grade 4 valve.  No cross-sectional imaging since then for review.    Correct, she is definitely not a TIF candidate.  She would need hiatal hernia repair for any antireflux surgery.  If she wants to consider laparoscopic hiatal hernia repair, we can consider doing so in conjunction with TIF or referring to Bariatric Surgery for a purely surgical hernia repair with fundoplication.  Since the hernia was 5 cm  in length 4 years ago, my suspicion would be that the hernia has increased in size since then, and may only be surgical candidate anyway.  But if asking about TIF alone, definitely not a candidate with that large hiatal hernia.  Thanks.  Vito ----- Message ----- From: Zehr, Jessica D, PA-C Sent: 04/29/2024  12:32 PM EDT To: Annis Kinder, DO  Hello.  Can you please review this patient's chart and just confirm for me that she is not a TIF candidate.  I told her that I did not think she was a candidate because of her large hiatal hernia.   Thank you,  Jess

## 2024-05-01 NOTE — Telephone Encounter (Signed)
 I spoke with the pt and discussed the recommendations per Dr Karene Oto.  She tells me that she prefers to think about her options and call back if she decides to go with surgical referral.  She did not want to set up appt with Dr Karene Oto at this point.

## 2024-06-10 ENCOUNTER — Encounter: Admitting: Gastroenterology

## 2024-07-28 ENCOUNTER — Encounter: Payer: Self-pay | Admitting: Gastroenterology

## 2024-08-04 ENCOUNTER — Telehealth: Payer: Self-pay | Admitting: Gastroenterology

## 2024-08-04 NOTE — Telephone Encounter (Signed)
 Attempted to reach patient. No answer, left VM for patient to return call.

## 2024-08-04 NOTE — Telephone Encounter (Signed)
 Inbound call from patient stating she would like to speak to nurse in regards to a medication protonix  she is taking and patient having a procedure scheduled for tomorrow 8/26.  Requesting a call back   Please advise Thank you

## 2024-08-04 NOTE — Telephone Encounter (Signed)
 Patient returning call, Please review  Thank you

## 2024-08-05 ENCOUNTER — Ambulatory Visit: Admitting: Gastroenterology

## 2024-08-05 ENCOUNTER — Encounter: Payer: Self-pay | Admitting: Gastroenterology

## 2024-08-05 VITALS — BP 141/77 | HR 68 | Temp 98.0°F | Resp 15 | Ht 60.0 in | Wt 147.4 lb

## 2024-08-05 DIAGNOSIS — K6389 Other specified diseases of intestine: Secondary | ICD-10-CM | POA: Diagnosis not present

## 2024-08-05 DIAGNOSIS — K648 Other hemorrhoids: Secondary | ICD-10-CM | POA: Diagnosis not present

## 2024-08-05 DIAGNOSIS — Z860100 Personal history of colon polyps, unspecified: Secondary | ICD-10-CM

## 2024-08-05 DIAGNOSIS — Z860101 Personal history of adenomatous and serrated colon polyps: Secondary | ICD-10-CM | POA: Diagnosis not present

## 2024-08-05 DIAGNOSIS — K5730 Diverticulosis of large intestine without perforation or abscess without bleeding: Secondary | ICD-10-CM | POA: Diagnosis not present

## 2024-08-05 DIAGNOSIS — K635 Polyp of colon: Secondary | ICD-10-CM

## 2024-08-05 DIAGNOSIS — Z1211 Encounter for screening for malignant neoplasm of colon: Secondary | ICD-10-CM | POA: Diagnosis present

## 2024-08-05 DIAGNOSIS — D123 Benign neoplasm of transverse colon: Secondary | ICD-10-CM

## 2024-08-05 MED ORDER — HYDROCORTISONE ACETATE 25 MG RE SUPP: 25.0000 mg | Freq: Two times a day (BID) | RECTAL | 1 refills | Status: AC | PRN

## 2024-08-05 MED ORDER — HYDROCORTISONE (PERIANAL) 2.5 % EX CREA: 1.0000 | TOPICAL_CREAM | Freq: Two times a day (BID) | CUTANEOUS | 1 refills | Status: AC | PRN

## 2024-08-05 MED ORDER — SODIUM CHLORIDE 0.9 % IV SOLN
500.0000 mL | Freq: Once | INTRAVENOUS | Status: DC
Start: 2024-08-05 — End: 2024-08-05

## 2024-08-05 NOTE — Progress Notes (Unsigned)
 [facility] History and Physical   Primary Care Physician:  Mat Browning, MD   Reason for Procedure:  History of adenomatous colon polyps  Plan:    Surveillance colonoscopy with possible interventions as needed     HPI: Victoria [name] is a very pleasant 63 y.o. female here for surveillance colonoscopy. Denies any nausea, vomiting, abdominal pain, melena or bright red blood per rectum  The risks and benefits as well as alternatives of endoscopic procedure(s) have been discussed and reviewed. All questions answered. The patient agrees to proceed.    Past Medical History:  Diagnosis Date   Allergy    Cancer (HCC)    skin cancer leg 2020   Complication of anesthesia    hard to wake up after Versed    Constipation    occ- diet changes help- not a chronic issue    GERD (gastroesophageal reflux disease)    Heart murmur    History of hiatal hernia    Hyperlipidemia    Hypertension    Hypothyroidism    Osteoporosis    Thyroid disease     Past Surgical History:  Procedure Laterality Date   bladder distention     BREAST BIOPSY Left 11/29/2023   MM LT BREAST BX W LOC DEV 1ST LESION IMAGE BX SPEC STEREO GUIDE 11/29/2023 [facility]   BREAST EXCISIONAL BIOPSY Left    x2 Benign   BREAST LUMPECTOMY Left 11/25/2020   RADIOACTIVE SEED GUIDED EXCISIONAL BREAST BIOPSY Left 11/25/2020   Procedure: LEFT BREAST RADIOACTIVE SEED GUIDED EXCISIONAL BREAST BIOPSY;  Surgeon: Ebbie Cough, MD;  Location: [facility];  Service: General;  Laterality: Left;   right cyst removed  2012   from ovary     Prior to Admission medications   Medication Sig Start Date End Date Taking? Authorizing Provider  ascorbic acid (VITAMIN C) 1000 MG tablet Take 1 tablet by mouth daily.   Yes [provider]  irbesartan (AVAPRO) 75 MG tablet Take 75 mg by mouth daily.   Yes [provider]  Levothyroxine Sodium (TIROSINT) 75 MCG CAPS Take by mouth daily  before breakfast.   Yes [provider]  liothyronine (CYTOMEL) 5 MCG tablet Take 5 mcg by mouth daily.   Yes [provider]  pantoprazole  (PROTONIX ) 40 MG tablet Take 1 tablet (40 mg total) by mouth daily. 07/21/20  Yes Gottlieb Zuercher V, MD  OVER THE COUNTER MEDICATION Fish Oil One capsule daily.    [provider]  OVER THE COUNTER MEDICATION Vitamin D 3 one capsule daily. Patient not taking: Reported on 07/21/2020    [provider]  OVER THE COUNTER MEDICATION B Complex vitamin, one tablet daily.  Patient not taking: Reported on 08/05/2024    [provider]  spironolactone (ALDACTONE) 50 MG tablet Take 50 mg by mouth daily. Patient not taking: Reported on 04/29/2024    [provider]  Vitamin D, Ergocalciferol, (DRISDOL) 1.25 MG (50000 UNIT) CAPS capsule Take 50,000 Units by mouth once a week. Patient not taking: Reported on 04/29/2024 05/23/20   [provider]    Current Outpatient Medications  Medication Sig Dispense Refill   ascorbic acid (VITAMIN C) 1000 MG tablet Take 1 tablet by mouth daily.     irbesartan (AVAPRO) 75 MG tablet Take 75 mg by mouth daily.     Levothyroxine Sodium (TIROSINT) 75 MCG CAPS Take by mouth daily before breakfast.     liothyronine (CYTOMEL) 5 MCG tablet Take 5 mcg by mouth  daily.     pantoprazole  (PROTONIX ) 40 MG tablet Take 1 tablet (40 mg total) by mouth daily. 30 tablet 12   OVER THE COUNTER MEDICATION Fish Oil One capsule daily.     OVER THE COUNTER MEDICATION Vitamin D 3 one capsule daily. (Patient not taking: Reported on 07/21/2020)     OVER THE COUNTER MEDICATION B Complex vitamin, one tablet daily.  (Patient not taking: Reported on 08/05/2024)     spironolactone (ALDACTONE) 50 MG tablet Take 50 mg by mouth daily. (Patient not taking: Reported on 04/29/2024)     Vitamin D, Ergocalciferol, (DRISDOL) 1.25 MG (50000 UNIT) CAPS capsule Take 50,000 Units by mouth once a week. (Patient not  taking: Reported on 04/29/2024)     Current Facility-Administered Medications  Medication Dose Route Frequency Provider Last Rate Last Admin   0.9 %  sodium chloride  infusion  500 mL Intravenous Once Luis Sami V, MD        Allergies as of 08/05/2024 - Review Complete 08/05/2024  Allergen Reaction Noted   Barium-containing compounds Itching 09/08/2011   Serotonin Other (See Comments) 06/25/2019   Penicillins Rash 12/20/2009   Tape Itching 06/25/2019    Family History  Problem Relation Age of Onset   Esophageal cancer Brother    Lung cancer Brother    Colon polyps Father    Colon cancer Paternal Grandmother    Rectal cancer Neg Hx    Stomach cancer Neg Hx     Social History   Socioeconomic History   Marital status: Divorced    Spouse name: Not on file   Number of children: Not on file   Years of education: Not on file   Highest education level: Not on file  Occupational History   Not on file  Tobacco Use   Smoking status: Never   Smokeless tobacco: Never   Tobacco comments:    parents smoke in the house  Vaping Use   Vaping status: Never Used  Substance and Sexual Activity   Alcohol use: Yes    Comment: rarely   Drug use: Not Currently   Sexual activity: Not on file  Other Topics Concern   Not on file  Social History Narrative   Not on file   Social Drivers of Health   Financial Resource Strain: Not on file  Food Insecurity: Not on file  Transportation Needs: Not on file  Physical Activity: Not on file  Stress: Not on file  Social Connections: Not on file  Intimate Partner Violence: Not on file    Review of Systems:  All other review of systems negative except as mentioned in the HPI.  Physical Exam: Vital signs in last 24 hours: BP (!) 151/100   Pulse 82   Temp 98 F (36.7 C)   Ht 5' (1.524 m)   Wt 147 lb 6.4 oz (66.9 kg)   LMP 12/11/2014   SpO2 97%   BMI 28.79 kg/m  General:   Alert, NAD Lungs:  Clear .   Heart:  Regular rate and  rhythm Abdomen:  Soft, nontender and nondistended. Neuro/Psych:  Alert and cooperative. Normal mood and affect. A and O x 3  Reviewed labs, radiology imaging, old records and pertinent past GI work up  Patient is appropriate for planned procedure(s) and anesthesia in an ambulatory setting   K. Veena Sumayya Muha , MD [phone]

## 2024-08-05 NOTE — Progress Notes (Unsigned)
 A/o x 3, VSS, gd SR's, pleased with anesthesia, report to RN

## 2024-08-05 NOTE — Op Note (Signed)
 [facility] Patient Name: Victoria [name] Procedure Date: 08/05/2024 12:41 PM MRN: [id number] Endoscopist: Gustav ALONSO Mcgee , MD, [id number] Age: 63 Referring MD:  Date of Birth: 11/14/1961 Gender: Female Account #: [id number] Procedure:                Colonoscopy Indications:              High risk colon cancer surveillance: Personal                            history of adenoma less than 10 mm in size Medicines:                Monitored Anesthesia Care Procedure:                Pre-Anesthesia Assessment:                           - Prior to the procedure, a History and Physical                            was performed, and patient medications and                            allergies were reviewed. The patient's tolerance of                            previous anesthesia was also reviewed. The risks                            and benefits of the procedure and the sedation                            options and risks were discussed with the patient.                            All questions were answered, and informed consent                            was obtained. Prior Anticoagulants: The patient has                            taken no anticoagulant or antiplatelet agents. ASA                            Grade Assessment: II - A patient with mild systemic                            disease. After reviewing the risks and benefits,                            the patient was deemed in satisfactory condition to                            undergo the procedure.  After obtaining informed consent, the colonoscope                            was passed under direct vision. Throughout the                            procedure, the patient's blood pressure, pulse, and                            oxygen saturations were monitored continuously. The                            Olympus Scope SN [id number] was introduced through the                            anus and  advanced to the the cecum, identified by                            appendiceal orifice and ileocecal valve. The                            colonoscopy was performed without difficulty. The                            patient tolerated the procedure well. The quality                            of the bowel preparation was good. The ileocecal                            valve, appendiceal orifice, and rectum were                            photographed. Scope In: 1:36:47 PM Scope Out: 1:51:29 PM Scope Withdrawal Time: 0 hours 9 minutes 36 seconds  Total Procedure Duration: 0 hours 14 minutes 42 seconds  Findings:                 The perianal and digital rectal examinations were                            normal.                           A 4 mm polyp was found in the transverse colon. The                            polyp was sessile. The polyp was removed with a                            cold snare. Resection and retrieval were complete.                           A few small-mouthed diverticula were found in the  sigmoid colon.                           Non-bleeding external and internal hemorrhoids were                            found during retroflexion. The hemorrhoids were                            small. Complications:            No immediate complications. Estimated Blood Loss:     Estimated blood loss was minimal. Impression:               - One 4 mm polyp in the transverse colon, removed                            with a cold snare. Resected and retrieved.                           - Diverticulosis in the sigmoid colon.                           - Non-bleeding external and internal hemorrhoids. Recommendation:           - Patient has a contact number available for                            emergencies. The signs and symptoms of potential                            delayed complications were discussed with the                            patient. Return  to normal activities tomorrow.                            Written discharge instructions were provided to the                            patient.                           - Resume previous diet.                           - Continue present medications.                           - Await pathology results.                           - Repeat colonoscopy in 5-10 years for surveillance                            based on pathology results. Nara Paternoster V. Laporsche Hoeger, MD 08/05/2024 1:59:40 PM This report has been signed electronically.

## 2024-08-05 NOTE — Patient Instructions (Addendum)
   Handouts on polyps,diverticulosis,& hemorrhoids given to you today.   Await pathology results on polyp removed    Continue previous diet & medications  Hemorrhoid suppository and cream ordered (sent to your pharmacy) by Dr Shila   YOU HAD AN ENDOSCOPIC PROCEDURE TODAY AT THE [facility]:   Refer to the procedure report that was given to you for any specific questions about what was found during the examination.  If the procedure report does not answer your questions, please call your gastroenterologist to clarify.  If you requested that your care partner not be given the details of your procedure findings, then the procedure report has been included in a sealed envelope for you to review at your convenience later.  YOU SHOULD EXPECT: Some feelings of bloating in the abdomen. Passage of more gas than usual.  Walking can help get rid of the air that was put into your GI tract during the procedure and reduce the bloating. If you had a lower endoscopy (such as a colonoscopy or flexible sigmoidoscopy) you may notice spotting of blood in your stool or on the toilet paper. If you underwent a bowel prep for your procedure, you may not have a normal bowel movement for a few days.  Please Note:  You might notice some irritation and congestion in your nose or some drainage.  This is from the oxygen used during your procedure.  There is no need for concern and it should clear up in a day or so.  SYMPTOMS TO REPORT IMMEDIATELY:  Following lower endoscopy (colonoscopy or flexible sigmoidoscopy):  Excessive amounts of blood in the stool  Significant tenderness or worsening of abdominal pains  Swelling of the abdomen that is new, acute  Fever of 100F or higher   For urgent or emergent issues, a gastroenterologist can be reached at any hour by calling [phone]. Do not use MyChart messaging for urgent concerns.    DIET:  We do recommend a small meal at first, but then you may  proceed to your regular diet.  Drink plenty of fluids but you should avoid alcoholic beverages for 24 hours.  ACTIVITY:  You should plan to take it easy for the rest of today and you should NOT DRIVE or use heavy machinery until tomorrow (because of the sedation medicines used during the test).    FOLLOW UP: Our staff will call the number listed on your records the next business day following your procedure.  We will call around 7:15- 8:00 am to check on you and address any questions or concerns that you may have regarding the information given to you following your procedure. If we do not reach you, we will leave a message.     If any biopsies were taken you will be contacted by phone or by letter within the next 1-3 weeks.  Please call us  at [phone] if you have not heard about the biopsies in 3 weeks.    SIGNATURES/CONFIDENTIALITY: You and/or your care partner have signed paperwork which will be entered into your electronic medical record.  These signatures attest to the fact that that the information above on your After Visit Summary has been reviewed and is understood.  Full responsibility of the confidentiality of this discharge information lies with you and/or your care-partner.

## 2024-08-06 ENCOUNTER — Telehealth: Payer: Self-pay | Admitting: *Deleted

## 2024-08-06 NOTE — Telephone Encounter (Signed)
  Follow up Call-     08/05/2024   12:47 PM  Call back number  Post procedure Call Back phone  # [phone]  Permission to leave phone message Yes     Patient questions:  Do you have a fever, pain , or abdominal swelling? No. Pain Score  0 *  Have you tolerated food without any problems? Yes.    Have you been able to return to your normal activities? Yes.    Do you have any questions about your discharge instructions: Diet   No. Medications  No. Follow up visit  No.  Do you have questions or concerns about your Care? No.  Actions: * If pain score is 4 or above: No action needed, pain <4.

## 2024-08-08 LAB — SURGICAL PATHOLOGY

## 2024-10-08 ENCOUNTER — Ambulatory Visit: Payer: Self-pay | Admitting: Gastroenterology

## 2024-11-21 ENCOUNTER — Telehealth: Payer: Self-pay

## 2024-11-21 NOTE — Telephone Encounter (Signed)
 RN received recall report showing colonoscopy recall date of 06/2024.  Patient recently had colonoscopy in 07/2024 and there is a new recall noted of 07/2034.

## 2024-12-25 ENCOUNTER — Other Ambulatory Visit: Payer: Self-pay | Admitting: Obstetrics and Gynecology

## 2024-12-25 DIAGNOSIS — R921 Mammographic calcification found on diagnostic imaging of breast: Secondary | ICD-10-CM

## 2025-01-05 ENCOUNTER — Encounter

## 2025-01-06 ENCOUNTER — Other Ambulatory Visit: Payer: Self-pay | Admitting: Obstetrics and Gynecology

## 2025-01-06 DIAGNOSIS — R921 Mammographic calcification found on diagnostic imaging of breast: Secondary | ICD-10-CM

## 2025-01-06 DIAGNOSIS — Z9889 Other specified postprocedural states: Secondary | ICD-10-CM

## 2025-01-07 ENCOUNTER — Inpatient Hospital Stay
Admission: RE | Admit: 2025-01-07 | Discharge: 2025-01-07 | Attending: Obstetrics and Gynecology | Admitting: Obstetrics and Gynecology

## 2025-01-07 DIAGNOSIS — Z9889 Other specified postprocedural states: Secondary | ICD-10-CM
